# Patient Record
Sex: Female | Born: 1980
Health system: Southern US, Community
[De-identification: ages and names within clinical notes are randomized; demographics above are authoritative.]

---

## 2003-02-13 ENCOUNTER — Emergency Department (HOSPITAL_COMMUNITY): Admission: EM | Admit: 2003-02-13 | Discharge: 2003-02-13 | Payer: Self-pay | Admitting: Unknown Physician Specialty

## 2003-02-14 ENCOUNTER — Encounter: Payer: Self-pay | Admitting: Emergency Medicine

## 2003-02-14 ENCOUNTER — Ambulatory Visit (HOSPITAL_COMMUNITY): Admission: RE | Admit: 2003-02-14 | Discharge: 2003-02-14 | Payer: Self-pay | Admitting: Emergency Medicine

## 2003-03-03 ENCOUNTER — Other Ambulatory Visit: Admission: RE | Admit: 2003-03-03 | Discharge: 2003-03-03 | Payer: Self-pay | Admitting: Obstetrics and Gynecology

## 2003-03-03 ENCOUNTER — Other Ambulatory Visit: Admission: RE | Admit: 2003-03-03 | Discharge: 2003-03-03 | Payer: Self-pay | Admitting: Internal Medicine

## 2004-05-01 ENCOUNTER — Emergency Department (HOSPITAL_COMMUNITY): Admission: EM | Admit: 2004-05-01 | Discharge: 2004-05-01 | Payer: Self-pay | Admitting: Family Medicine

## 2004-05-02 ENCOUNTER — Emergency Department (HOSPITAL_COMMUNITY): Admission: EM | Admit: 2004-05-02 | Discharge: 2004-05-02 | Payer: Self-pay | Admitting: Family Medicine

## 2004-05-03 ENCOUNTER — Ambulatory Visit (HOSPITAL_COMMUNITY): Admission: RE | Admit: 2004-05-03 | Discharge: 2004-05-03 | Payer: Self-pay | Admitting: Family Medicine

## 2004-07-09 ENCOUNTER — Other Ambulatory Visit: Admission: RE | Admit: 2004-07-09 | Discharge: 2004-07-09 | Payer: Self-pay | Admitting: Family Medicine

## 2004-08-16 ENCOUNTER — Emergency Department (HOSPITAL_COMMUNITY): Admission: EM | Admit: 2004-08-16 | Discharge: 2004-08-16 | Payer: Self-pay | Admitting: Emergency Medicine

## 2005-08-24 ENCOUNTER — Emergency Department (HOSPITAL_COMMUNITY): Admission: EM | Admit: 2005-08-24 | Discharge: 2005-08-24 | Payer: Self-pay | Admitting: Emergency Medicine

## 2005-09-15 ENCOUNTER — Encounter: Admission: RE | Admit: 2005-09-15 | Discharge: 2005-09-15 | Payer: Self-pay | Admitting: Neurology

## 2006-01-11 ENCOUNTER — Other Ambulatory Visit: Admission: RE | Admit: 2006-01-11 | Discharge: 2006-01-11 | Payer: Self-pay | Admitting: Family Medicine

## 2007-01-12 ENCOUNTER — Other Ambulatory Visit: Admission: RE | Admit: 2007-01-12 | Discharge: 2007-01-12 | Payer: Self-pay | Admitting: Family Medicine

## 2007-06-28 ENCOUNTER — Encounter: Admission: RE | Admit: 2007-06-28 | Discharge: 2007-06-28 | Payer: Self-pay | Admitting: Family Medicine

## 2008-01-15 ENCOUNTER — Other Ambulatory Visit: Admission: RE | Admit: 2008-01-15 | Discharge: 2008-01-15 | Payer: Self-pay | Admitting: Family Medicine

## 2009-03-11 ENCOUNTER — Other Ambulatory Visit: Admission: RE | Admit: 2009-03-11 | Discharge: 2009-03-11 | Payer: Self-pay | Admitting: Family Medicine

## 2010-01-02 ENCOUNTER — Emergency Department (HOSPITAL_COMMUNITY): Admission: EM | Admit: 2010-01-02 | Discharge: 2010-01-02 | Payer: Self-pay | Admitting: Emergency Medicine

## 2010-03-12 ENCOUNTER — Other Ambulatory Visit: Admission: RE | Admit: 2010-03-12 | Discharge: 2010-03-12 | Payer: Self-pay | Admitting: Family Medicine

## 2010-03-19 ENCOUNTER — Encounter: Admission: RE | Admit: 2010-03-19 | Discharge: 2010-03-19 | Payer: Self-pay | Admitting: Family Medicine

## 2010-03-30 ENCOUNTER — Other Ambulatory Visit: Admission: RE | Admit: 2010-03-30 | Discharge: 2010-03-30 | Payer: Self-pay | Admitting: Interventional Radiology

## 2010-03-30 ENCOUNTER — Encounter: Admission: RE | Admit: 2010-03-30 | Discharge: 2010-03-30 | Payer: Self-pay | Admitting: Family Medicine

## 2010-06-19 ENCOUNTER — Emergency Department (HOSPITAL_COMMUNITY): Admission: EM | Admit: 2010-06-19 | Discharge: 2010-06-19 | Payer: Self-pay | Admitting: Emergency Medicine

## 2010-12-05 ENCOUNTER — Encounter: Payer: Self-pay | Admitting: Family Medicine

## 2011-01-28 LAB — POCT URINALYSIS DIPSTICK
Bilirubin Urine: NEGATIVE
Glucose, UA: NEGATIVE mg/dL
Hgb urine dipstick: NEGATIVE
Ketones, ur: NEGATIVE mg/dL
Nitrite: NEGATIVE
Protein, ur: NEGATIVE mg/dL
Specific Gravity, Urine: 1.015 (ref 1.005–1.030)
Urobilinogen, UA: 0.2 mg/dL (ref 0.0–1.0)
pH: 8.5 — ABNORMAL HIGH (ref 5.0–8.0)

## 2011-01-28 LAB — POCT PREGNANCY, URINE: Preg Test, Ur: NEGATIVE

## 2011-03-15 ENCOUNTER — Other Ambulatory Visit (HOSPITAL_COMMUNITY)
Admission: RE | Admit: 2011-03-15 | Discharge: 2011-03-15 | Disposition: A | Discharge status: Home / Self Care | Payer: BC Managed Care – PPO | Source: Ambulatory Visit | Attending: Family Medicine | Admitting: Family Medicine

## 2011-03-15 ENCOUNTER — Other Ambulatory Visit: Payer: Self-pay | Admitting: Family Medicine

## 2011-03-15 DIAGNOSIS — Z01419 Encounter for gynecological examination (general) (routine) without abnormal findings: Secondary | ICD-10-CM | POA: Insufficient documentation

## 2011-04-15 ENCOUNTER — Other Ambulatory Visit: Payer: Self-pay | Admitting: Family Medicine

## 2011-04-15 DIAGNOSIS — R102 Pelvic and perineal pain: Secondary | ICD-10-CM

## 2011-04-18 ENCOUNTER — Ambulatory Visit
Admission: RE | Admit: 2011-04-18 | Discharge: 2011-04-18 | Disposition: A | Discharge status: Home / Self Care | Payer: BC Managed Care – PPO | Source: Ambulatory Visit | Attending: Family Medicine | Admitting: Family Medicine

## 2011-04-18 DIAGNOSIS — R102 Pelvic and perineal pain: Secondary | ICD-10-CM

## 2011-05-19 ENCOUNTER — Other Ambulatory Visit: Payer: Self-pay | Admitting: Family Medicine

## 2011-05-19 DIAGNOSIS — N83202 Unspecified ovarian cyst, left side: Secondary | ICD-10-CM

## 2011-06-15 ENCOUNTER — Other Ambulatory Visit: Payer: BC Managed Care – PPO

## 2011-06-22 ENCOUNTER — Ambulatory Visit
Admission: RE | Admit: 2011-06-22 | Discharge: 2011-06-22 | Disposition: A | Discharge status: Home / Self Care | Payer: BC Managed Care – PPO | Source: Ambulatory Visit | Attending: Family Medicine | Admitting: Family Medicine

## 2011-06-22 DIAGNOSIS — N83202 Unspecified ovarian cyst, left side: Secondary | ICD-10-CM

## 2011-11-28 ENCOUNTER — Other Ambulatory Visit: Payer: Self-pay | Admitting: Family Medicine

## 2011-11-28 DIAGNOSIS — E049 Nontoxic goiter, unspecified: Secondary | ICD-10-CM

## 2011-11-30 ENCOUNTER — Ambulatory Visit
Admission: RE | Admit: 2011-11-30 | Discharge: 2011-11-30 | Disposition: A | Discharge status: Home / Self Care | Payer: BC Managed Care – PPO | Source: Ambulatory Visit | Attending: Family Medicine | Admitting: Family Medicine

## 2011-11-30 DIAGNOSIS — E049 Nontoxic goiter, unspecified: Secondary | ICD-10-CM

## 2012-01-15 IMAGING — US US TRANSVAGINAL NON-OB
1 series · 13 of 25 positions shown · non-contrast
Comparison: 04/18/2011.

CLINICAL DATA: Left ovarian cystic lesion follow-up.  History of
pain with menstrual cycles.

TRANSVAGINAL ULTRASOUND OF PELVIS
TECHNIQUE: Transvaginal ultrasound examination of the pelvis was
performed including evaluation of the uterus, ovaries, adnexal
regions, and pelvic cul-de-sac.

[Series 1: us transvaginal non-ob · 0.10mm/px · 13 of 54 slices shown]
[im 1/54]
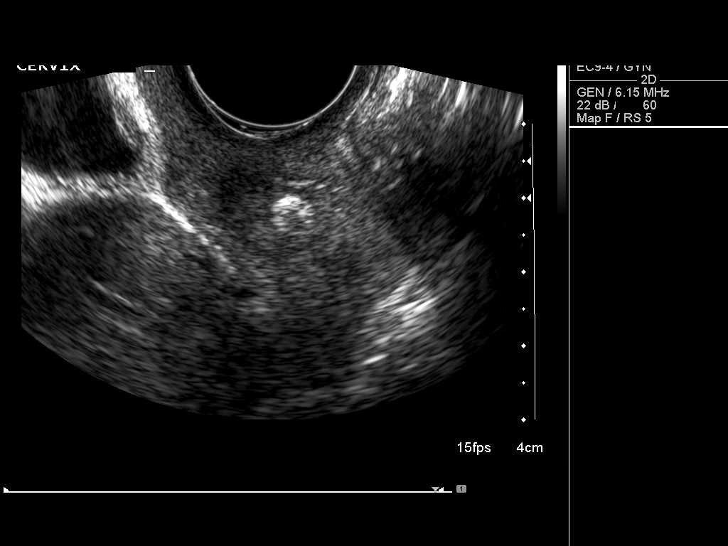
[im 5/54]
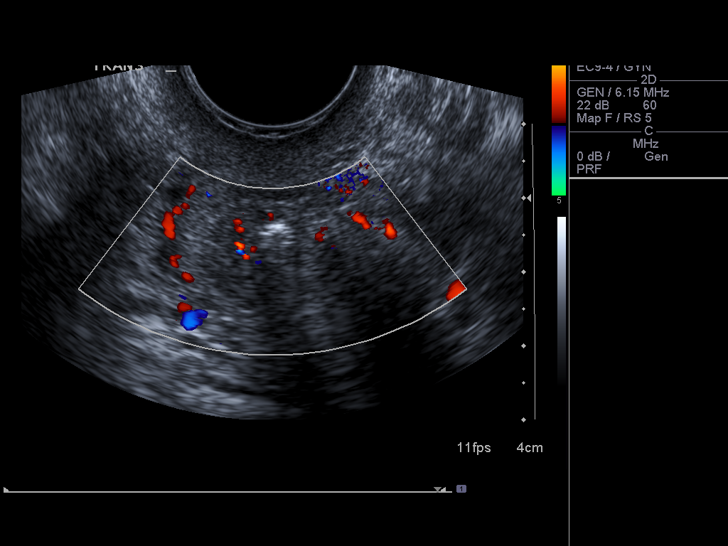
[im 9/54]
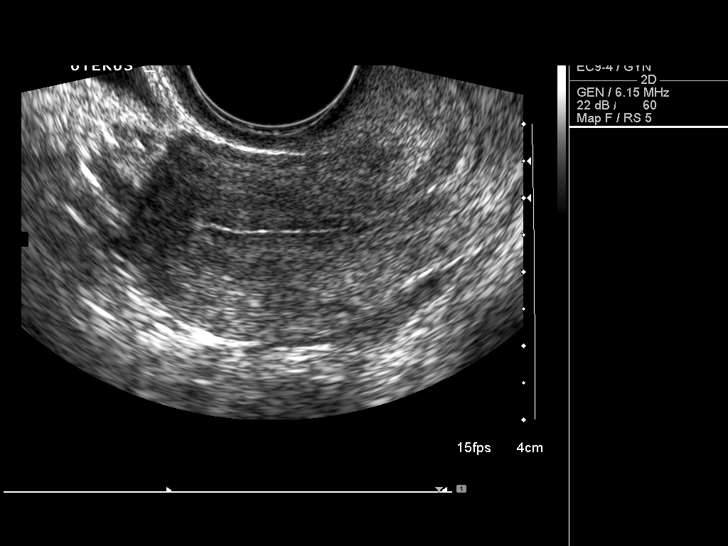
[im 14/54]
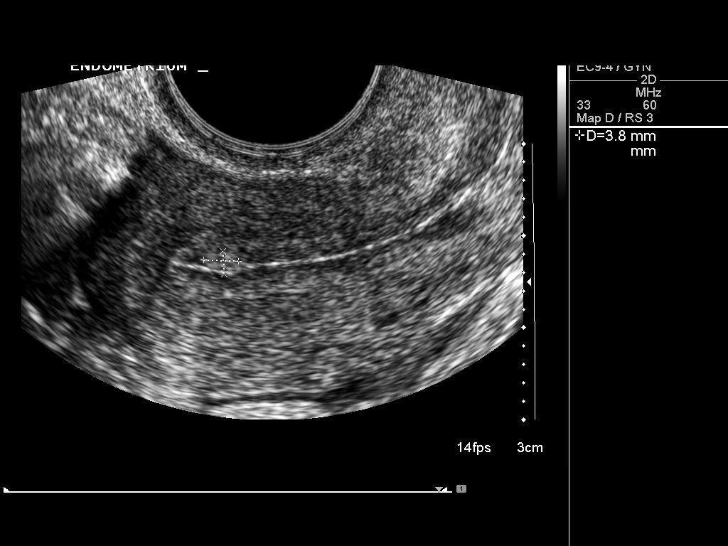
[im 18/54]
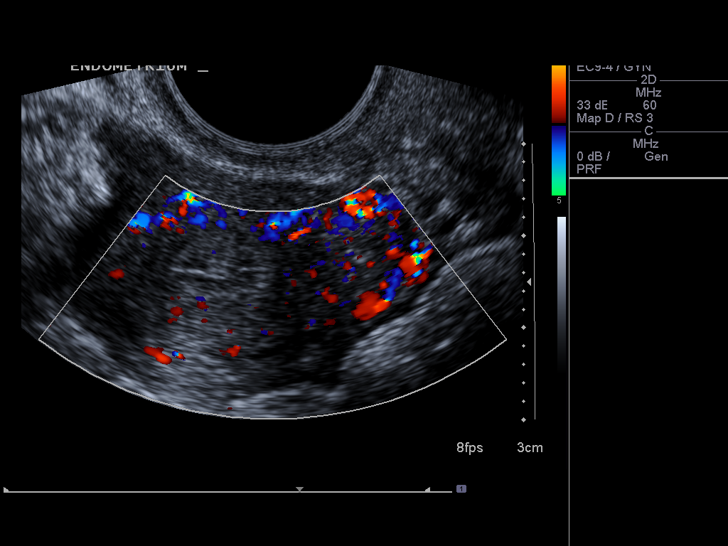
[im 23/54]
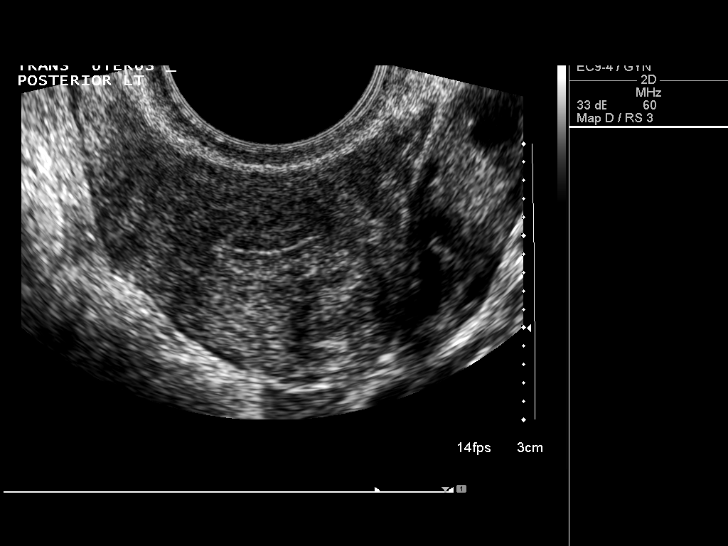
[im 27/54]
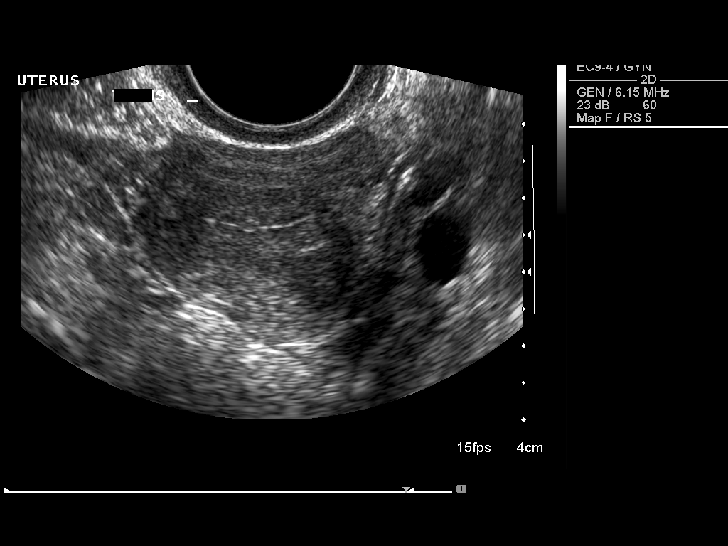
[im 31/54]
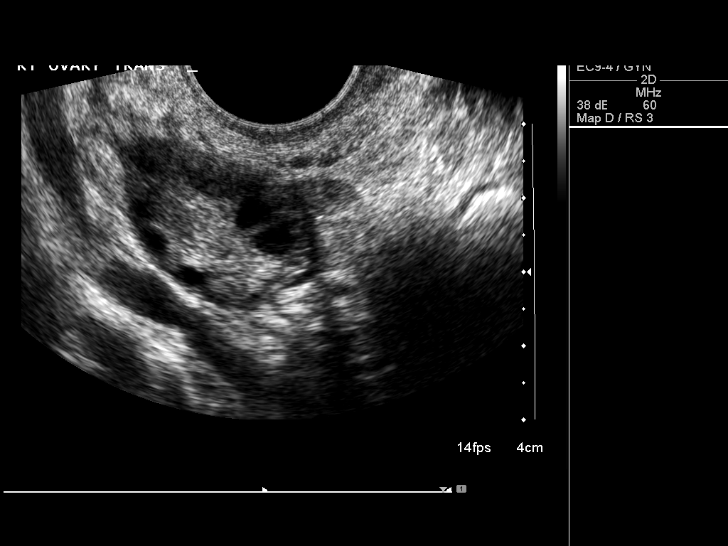
[im 36/54]
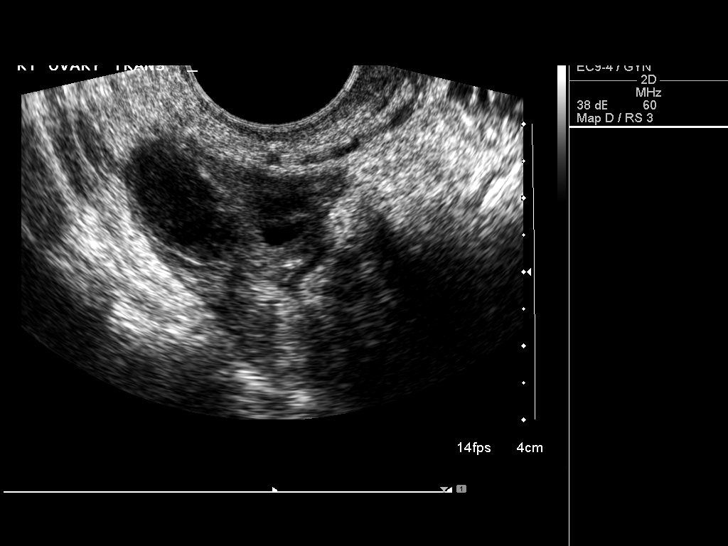
[im 40/54]
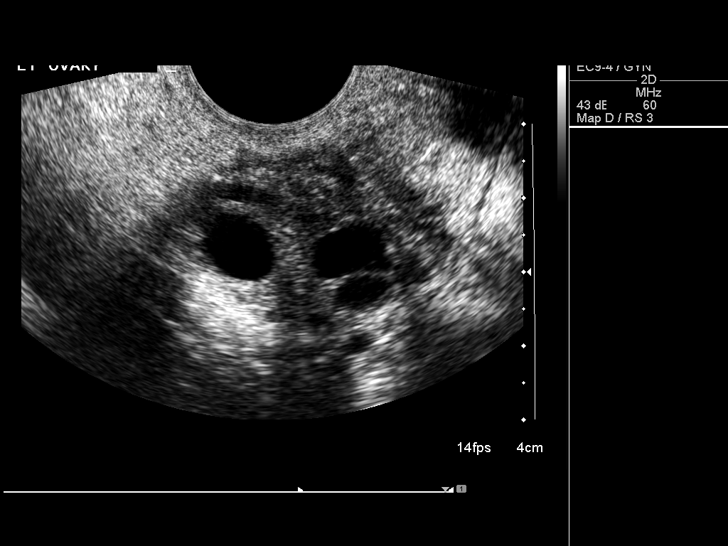
[im 45/54]
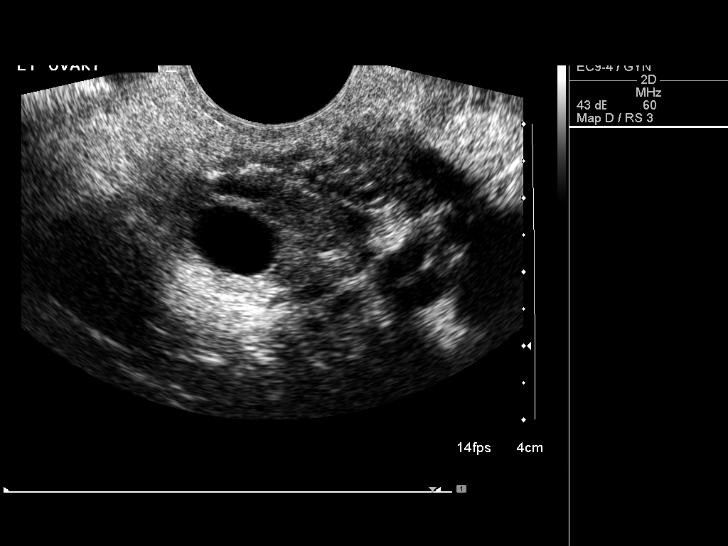
[im 49/54]
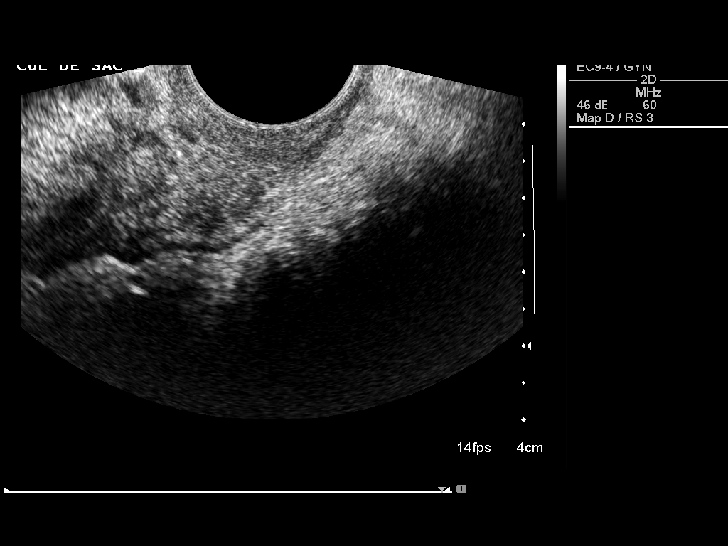
[im 54/54]
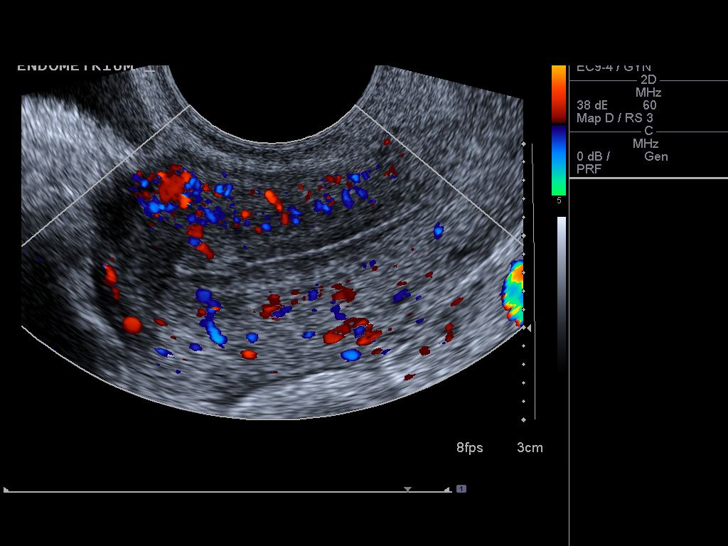

[13 of 25 positions shown; findings below may reference images not displayed]

FINDINGS: Uterus:  Uterus measures 6.7 x 3.0 x 3.1 cm.  There is a
hyperechoic area in the region of the cervix.  This area measures
6.5 x 5.1 x 4.5 cm.  There is some posterior acoustic shadowing
consistent with calcification.  There is an intramural uterine
leiomyoma measuring 1.3 x 1.3 x 1.2 cm.

Endometrium: Endometrial thickness is 7.5 mm.  No endometrial fluid
is seen.  There is a nodular area within the endometrium in the
fundal portion.  It is slightly hyperechoic.  It measures 3.8 x
x 2.4 mm and could reflect a polyp.

Right ovary: Right ovary measures 3.5 x 2.2 x 2.5 cm.  On the
previous study it measured 2.8 x 2.7 x 2.3 cm. There is a cystic
area with some internal echoes.  This area measured 1.9 x 1.2 x
cm.  There is no internal color Doppler flow.  This cystic area was
not seen on the previous study of 04/18/2011 and consistent with a
complicated cyst.  Small follicular cysts are also seen.

Left ovary: The left ovary measured 3.5 x 1.7 x 1.3 cm.  On the
previous study it measured 3.8 x 3.7 x 2.7 cm.  It is smaller on
the current study.  Small follicular cysts are now seen.  The
complex cystic area within the left ovary seen on the previous
study has resolved.

Other Findings:  No free fluid
IMPRESSION: There is a hyperechoic area in the region of the cervix.  This area
measures 6.5 x 5.1 x 4.5 cm.  There is some posterior acoustic
shadowing consistent with calcification.  There is an intramural
uterine leiomyoma measuring 1.3 x 1.3-1.2 cm.

 There is a nodular area within the endometrium in the fundal
portion.  It is slightly hyperechoic.  It measures 3.8 x 2.5 x
mm and could reflect a polyp. Hysterosonography may be considered
for additional evaluation.

Normal size of right ovary.There is a cystic area with some
internal echoes.  This area measured 1.9 x 1.2 x 1.4 cm.  There is
no internal color Doppler flow.  This cystic area was not seen on
the previous study of 04/18/2011 and consistent with a complicated
cyst.  Small follicular cysts are also seen.

Left ovary is smaller than on the previous study.  Small follicular
cysts are now seen.  The complex cystic area within the left ovary
seen on the previous study has resolved.

## 2013-04-10 ENCOUNTER — Ambulatory Visit (INDEPENDENT_AMBULATORY_CARE_PROVIDER_SITE_OTHER): Payer: BC Managed Care – PPO | Admitting: Physician Assistant

## 2013-04-10 VITALS — BP 146/86 | HR 69 | Temp 97.7°F | Resp 16 | Ht 67.0 in | Wt 159.6 lb

## 2013-04-10 DIAGNOSIS — M549 Dorsalgia, unspecified: Secondary | ICD-10-CM

## 2013-04-10 DIAGNOSIS — R300 Dysuria: Secondary | ICD-10-CM

## 2013-04-10 DIAGNOSIS — R309 Painful micturition, unspecified: Secondary | ICD-10-CM

## 2013-04-10 DIAGNOSIS — N390 Urinary tract infection, site not specified: Secondary | ICD-10-CM

## 2013-04-10 LAB — POCT URINALYSIS DIPSTICK
Bilirubin, UA: NEGATIVE
Glucose, UA: NEGATIVE
Ketones, UA: 15
Nitrite, UA: NEGATIVE
Protein, UA: >=300
Spec Grav, UA: 1.015
Urobilinogen, UA: 0.2
pH, UA: 6

## 2013-04-10 LAB — POCT UA - MICROSCOPIC ONLY
Casts, Ur, LPF, POC: NEGATIVE
Crystals, Ur, HPF, POC: NEGATIVE
Yeast, UA: NEGATIVE

## 2013-04-10 MED ORDER — PHENAZOPYRIDINE HCL 200 MG PO TABS: 200.0000 mg | ORAL_TABLET | Freq: Three times a day (TID) | ORAL | Status: DC | PRN

## 2013-04-10 MED ORDER — NITROFURANTOIN MONOHYD MACRO 100 MG PO CAPS: 100.0000 mg | ORAL_CAPSULE | Freq: Two times a day (BID) | ORAL | Status: DC

## 2013-04-10 NOTE — Patient Instructions (Addendum)
Begin taking the antibiotic as directed.  Be sure to finish the full course.  Take pyridium if needed for symptoms.  Plenty of fluids (water is best!)  Please let me know if symptoms are worsening or not improving.  I will let you know when the culture data is back and if we need to make any changes based on that.   Urinary Tract Infection Urinary tract infections (UTIs) can develop anywhere along your urinary tract. Your urinary tract is your body's drainage system for removing wastes and extra water. Your urinary tract includes two kidneys, two ureters, a bladder, and a urethra. Your kidneys are a pair of bean-shaped organs. Each kidney is about the size of your fist. They are located below your ribs, one on each side of your spine. CAUSES Infections are caused by microbes, which are microscopic organisms, including fungi, viruses, and bacteria. These organisms are so small that they can only be seen through a microscope. Bacteria are the microbes that most commonly cause UTIs. SYMPTOMS  Symptoms of UTIs may vary by age and gender of the patient and by the location of the infection. Symptoms in young women typically include a frequent and intense urge to urinate and a painful, burning feeling in the bladder or urethra during urination. Older women and men are more likely to be tired, shaky, and weak and have muscle aches and abdominal pain. A fever may mean the infection is in your kidneys. Other symptoms of a kidney infection include pain in your back or sides below the ribs, nausea, and vomiting. DIAGNOSIS To diagnose a UTI, your caregiver will ask you about your symptoms. Your caregiver also will ask to provide a urine sample. The urine sample will be tested for bacteria and white blood cells. White blood cells are made by your body to help fight infection. TREATMENT  Typically, UTIs can be treated with medication. Because most UTIs are caused by a bacterial infection, they usually can be treated  with the use of antibiotics. The choice of antibiotic and length of treatment depend on your symptoms and the type of bacteria causing your infection. HOME CARE INSTRUCTIONS  If you were prescribed antibiotics, take them exactly as your caregiver instructs you. Finish the medication even if you feel better after you have only taken some of the medication.  Drink enough water and fluids to keep your urine clear or pale yellow.  Avoid caffeine, tea, and carbonated beverages. They tend to irritate your bladder.  Empty your bladder often. Avoid holding urine for long periods of time.  Empty your bladder before and after sexual intercourse.  After a bowel movement, women should cleanse from front to back. Use each tissue only once. SEEK MEDICAL CARE IF:   You have back pain.  You develop a fever.  Your symptoms do not begin to resolve within 3 days. SEEK IMMEDIATE MEDICAL CARE IF:   You have severe back pain or lower abdominal pain.  You develop chills.  You have nausea or vomiting.  You have continued burning or discomfort with urination. MAKE SURE YOU:   Understand these instructions.  Will watch your condition.  Will get help right away if you are not doing well or get worse. Document Released: 08/10/2005 Document Revised: 05/01/2012 Document Reviewed: 12/09/2011 [facility] Patient Information 2014 [place], [place].

## 2013-04-10 NOTE — Progress Notes (Signed)
Subjective:    Patient ID: Miranda Rivera, female    DOB: 03-21-1981, 32 y.o.   MRN: [id number]  HPI   Miranda Rivera is a very pleasant 32 yr old female here with concern for illness.  Has noted a couple days of abnormal urine odor.  Today began experiencing fullness, pain in lower abdomen with urination.  Also with frequency.  This afternoon she noted frank hematuria which prompted her to come in.  Has also been having some right sided back pain today.  No NV, FC.  She has never had a UTI before.  Denies abnormal vaginal discharge.  Has had a yeast infection in this past, but this feels different.  LMP 04/02/13.  Sexually active with her husband only, no concern for STIs.     Review of Systems  Constitutional: Negative for fever and chills.  HENT: Negative.   Respiratory: Negative.   Cardiovascular: Negative.   Gastrointestinal: Negative.   Genitourinary: Positive for dysuria, frequency and hematuria. Negative for vaginal discharge and pelvic pain.  Musculoskeletal: Negative.   Skin: Negative.   Neurological: Negative.        Objective:   Physical Exam  Vitals reviewed. Constitutional: She is oriented to person, place, and time. She appears well-developed and well-nourished. No distress.  HENT:  Head: Normocephalic and atraumatic.  Eyes: Conjunctivae are normal. No scleral icterus.  Cardiovascular: Normal rate, regular rhythm and normal heart sounds.   Pulmonary/Chest: Effort normal and breath sounds normal. She has no wheezes. She has no rales.  Abdominal: Soft. Bowel sounds are normal. There is tenderness in the suprapubic area. There is no rigidity, no rebound, no guarding and no CVA tenderness.  Neurological: She is alert and oriented to person, place, and time.  Skin: Skin is warm and dry.  Psychiatric: She has a normal mood and affect. Her behavior is normal.     Results for orders placed in visit on 04/10/13  POCT URINALYSIS DIPSTICK      Result Value Range   Color, UA  bloody     Clarity, UA cloudy     Glucose, UA neg     Bilirubin, UA neg     Ketones, UA 15     Spec Grav, UA 1.015     Blood, UA large     pH, UA 6.0     Protein, UA >=300     Urobilinogen, UA 0.2     Nitrite, UA neg     Leukocytes, UA moderate (2+)    POCT UA - MICROSCOPIC ONLY      Result Value Range   WBC, Ur, HPF, POC 5-10     RBC, urine, microscopic tntc     Bacteria, U Microscopic trace     Mucus, UA small     Epithelial cells, urine per micros 0-1     Crystals, Ur, HPF, POC neg     Casts, Ur, LPF, POC neg     Yeast, UA neg          Assessment & Plan:  UTI (urinary tract infection) - Plan: Urine culture, nitrofurantoin, macrocrystal-monohydrate, (MACROBID) 100 MG capsule, phenazopyridine (PYRIDIUM) 200 MG tablet  Painful urination - Plan: POCT urinalysis dipstick, POCT UA - Microscopic Only, phenazopyridine (PYRIDIUM) 200 MG tablet  Back pain - Plan: POCT urinalysis dipstick, POCT UA - Microscopic Only   Miranda Rivera is a very pleasant 31 yr old female with UTI.  UA reveals large blood and moderate leuks.  Urine cx sent.  Pt  with subjective right flank pain but no CVA tenderness, nausea, vomiting, or fever; therefore I am less concerned about pyelo.  Will start Macrobid x 5 days.  Will adjust based on culture data if needed.  Pyridium q8h prn symptoms.  Push fluids.  If worsening or not improving, pt to RTC for further evaluation.

## 2013-04-13 LAB — URINE CULTURE: Colony Count: >=100000

## 2013-11-01 ENCOUNTER — Other Ambulatory Visit: Payer: Self-pay | Admitting: Family Medicine

## 2013-11-01 ENCOUNTER — Other Ambulatory Visit (HOSPITAL_COMMUNITY)
Admission: RE | Admit: 2013-11-01 | Discharge: 2013-11-01 | Disposition: A | Discharge status: Home / Self Care | Payer: BC Managed Care – PPO | Source: Ambulatory Visit | Attending: Family Medicine | Admitting: Family Medicine

## 2013-11-01 DIAGNOSIS — Z01419 Encounter for gynecological examination (general) (routine) without abnormal findings: Secondary | ICD-10-CM | POA: Insufficient documentation

## 2013-11-01 DIAGNOSIS — Z1151 Encounter for screening for human papillomavirus (HPV): Secondary | ICD-10-CM | POA: Insufficient documentation

## 2014-11-03 ENCOUNTER — Other Ambulatory Visit: Payer: Self-pay | Admitting: Obstetrics and Gynecology

## 2014-11-05 LAB — CYTOLOGY - PAP

## 2015-02-22 ENCOUNTER — Ambulatory Visit (INDEPENDENT_AMBULATORY_CARE_PROVIDER_SITE_OTHER): Payer: Self-pay | Admitting: Family Medicine

## 2015-02-22 VITALS — BP 132/80 | HR 60 | Temp 98.0°F | Resp 17 | Ht 68.5 in | Wt 154.0 lb

## 2015-02-22 DIAGNOSIS — R05 Cough: Secondary | ICD-10-CM

## 2015-02-22 DIAGNOSIS — R059 Cough, unspecified: Secondary | ICD-10-CM

## 2015-02-22 DIAGNOSIS — J209 Acute bronchitis, unspecified: Secondary | ICD-10-CM

## 2015-02-22 MED ORDER — PREDNISONE 20 MG PO TABS: 40.0000 mg | ORAL_TABLET | Freq: Every day | ORAL | Status: DC

## 2015-02-22 MED ORDER — HYDROCODONE-HOMATROPINE 5-1.5 MG/5ML PO SYRP: 5.0000 mL | ORAL_SOLUTION | Freq: Three times a day (TID) | ORAL | Status: DC | PRN

## 2015-02-22 MED ORDER — AZITHROMYCIN 250 MG PO TABS: ORAL_TABLET | ORAL | Status: DC

## 2015-02-22 NOTE — Patient Instructions (Signed)

## 2015-02-22 NOTE — Progress Notes (Addendum)
° °  Subjective:  This chart was scribed for Elvina SidleKurt Lauenstein, MD, by Elon SpannerGarrett Cook, ED Scribe. This patient was seen in room Rm 5 and the patient's care was started at 10:05 AM.   Patient ID: Miranda Rivera, female    DOB: [birth year], 34 y.o.   MRN: [id number] Chief Complaint  Patient presents with   Cough   Laryngitis   URI   Abdominal Pain    HPI HPI Comments: Miranda LevelKhadijah Rivera is a 34 y.o. female who presents to the Emergency Department complaining of a cough, intermittently productive of green sputum, present only at night and preventing her from sleeping onset 1 week ago.  She also notes a current voice hoarse and abdominal pain secondary to cough.  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  She has a history of allergies for which she takes medication.  Patient denies a history of asthma.  Patient denies fever.     Review of Systems     Objective:   Physical Exam  Constitutional: She is oriented to person, place, and time. She appears well-developed and well-nourished. No distress.  HENT:  Head: Normocephalic and atraumatic.  Eyes: Conjunctivae and EOM are normal.  Neck: Normal range of motion. Neck supple. No tracheal deviation present. No thyromegaly present.  Cardiovascular: Normal rate, regular rhythm and normal heart sounds.   Pulmonary/Chest: Effort normal. No respiratory distress. She has wheezes. She has no rales. She exhibits no tenderness.  Musculoskeletal: Normal range of motion.  Neurological: She is alert and oriented to person, place, and time.  Skin: Skin is warm and dry.  Psychiatric: She has a normal mood and affect. Her behavior is normal.  Nursing note and vitals reviewed.      Assessment & Plan:  10:13 AM Will prescribe antibiotics, cough medication, and oral steroids.  Patient acknowledges and agrees with plan.   This chart was scribed in my presence and reviewed by me personally.    ICD-9-CM ICD-10-CM   1. Cough 786.2 R05 azithromycin  (ZITHROMAX Z-PAK) 250 MG tablet     predniSONE (DELTASONE) 20 MG tablet     HYDROcodone-homatropine (HYCODAN) 5-1.5 MG/5ML syrup  2. Acute bronchitis, unspecified organism 466.0 J20.9 azithromycin (ZITHROMAX Z-PAK) 250 MG tablet     predniSONE (DELTASONE) 20 MG tablet     Signed, Elvina SidleKurt Lauenstein, MD

## 2015-05-05 ENCOUNTER — Other Ambulatory Visit: Payer: Self-pay | Admitting: Family Medicine

## 2015-05-05 DIAGNOSIS — E041 Nontoxic single thyroid nodule: Secondary | ICD-10-CM

## 2015-05-05 DIAGNOSIS — E049 Nontoxic goiter, unspecified: Secondary | ICD-10-CM

## 2015-05-07 ENCOUNTER — Ambulatory Visit
Admission: RE | Admit: 2015-05-07 | Discharge: 2015-05-07 | Disposition: A | Discharge status: Home / Self Care | Payer: BLUE CROSS/BLUE SHIELD | Source: Ambulatory Visit | Attending: Family Medicine | Admitting: Family Medicine

## 2015-05-07 DIAGNOSIS — E049 Nontoxic goiter, unspecified: Secondary | ICD-10-CM

## 2015-05-07 DIAGNOSIS — E041 Nontoxic single thyroid nodule: Secondary | ICD-10-CM

## 2015-11-30 IMAGING — US US SOFT TISSUE HEAD/NECK
1 series · 13 of 25 positions shown · non-contrast
Comparison: 11/30/2011;

CLINICAL DATA: Left-sided thyroid nodule follow-up. History of
prior left-sided thyroid nodule biopsy (1022).

EXAM:
THYROID ULTRASOUND
TECHNIQUE: Ultrasound examination of the thyroid gland and adjacent soft
tissues was performed.

[Series 1: us soft tissue head/neck · 0.08mm/px · 13 of 40 slices shown]
[im 1/40]
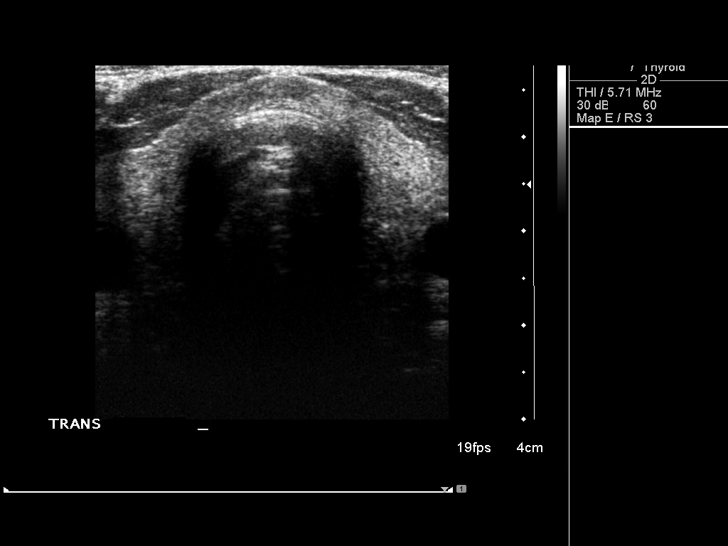
[im 4/40]
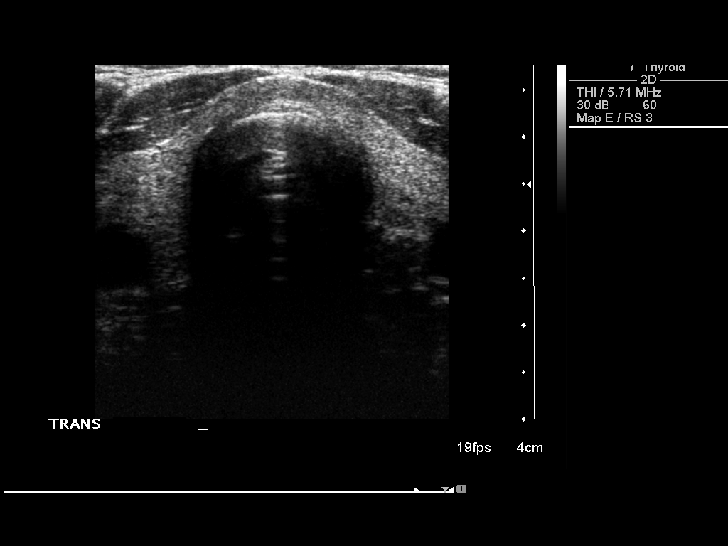
[im 7/40]
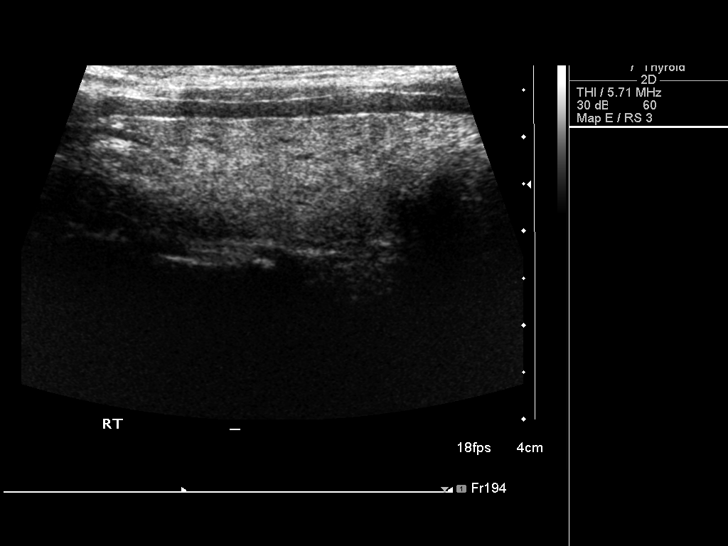
[im 10/40]
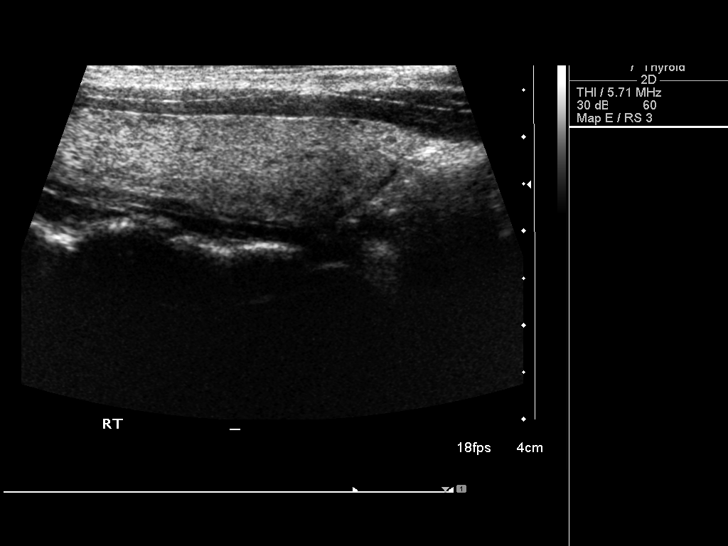
[im 14/40]
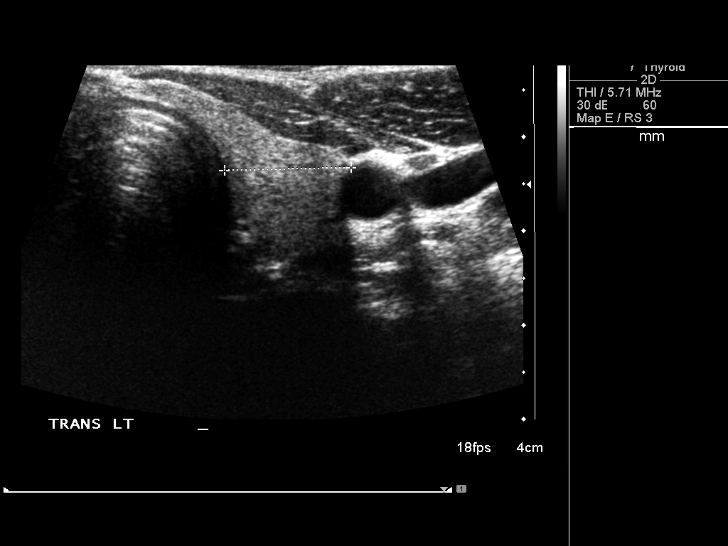
[im 17/40]
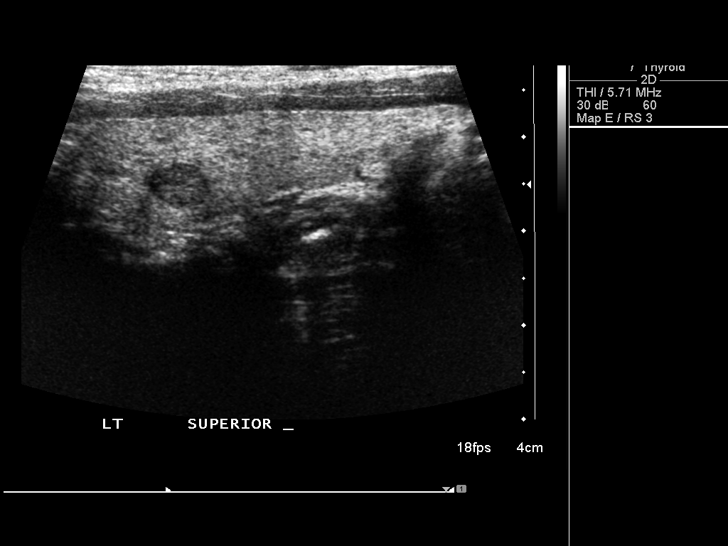
[im 20/40]
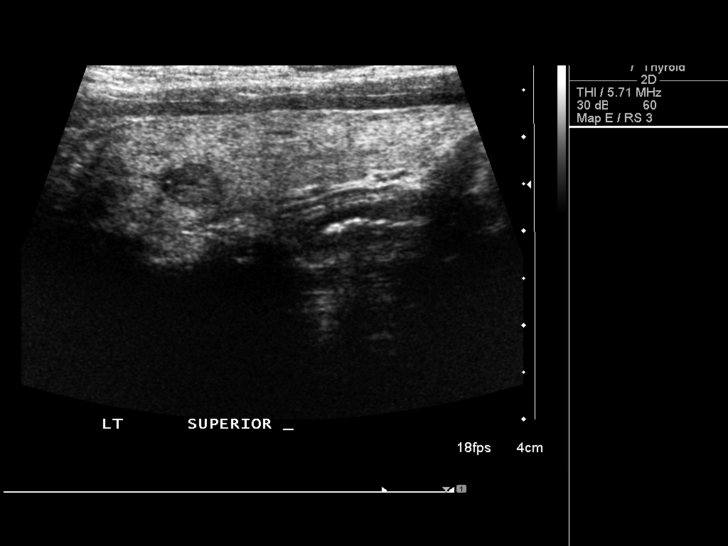
[im 23/40]
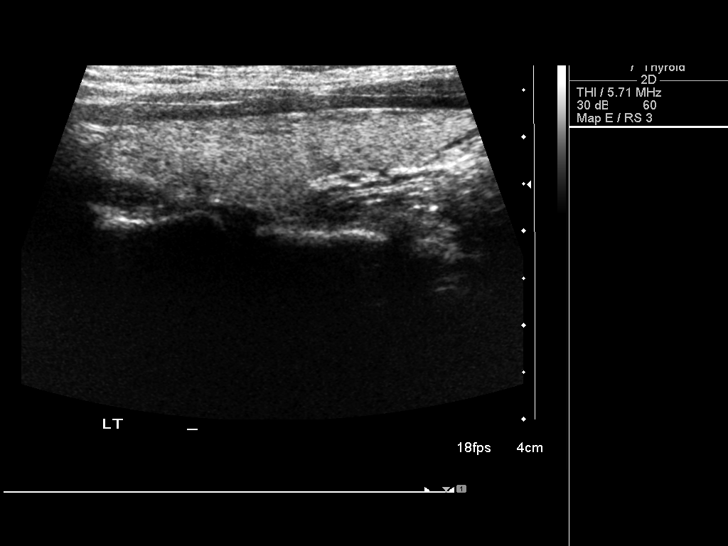
[im 27/40]
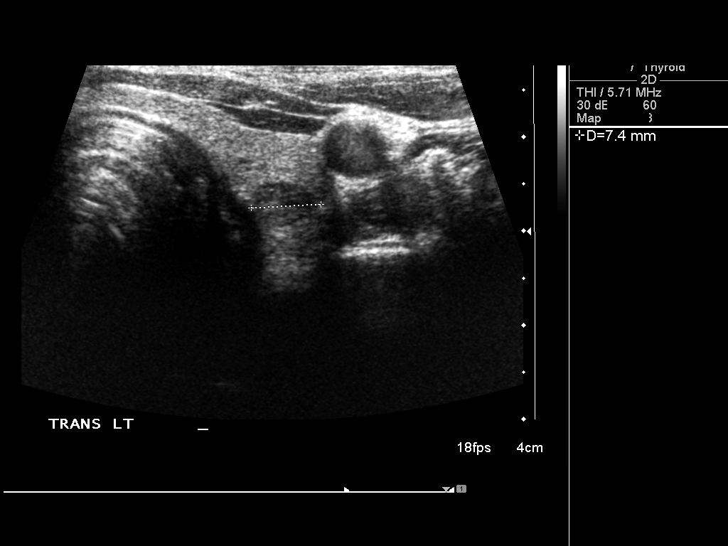
[im 30/40]
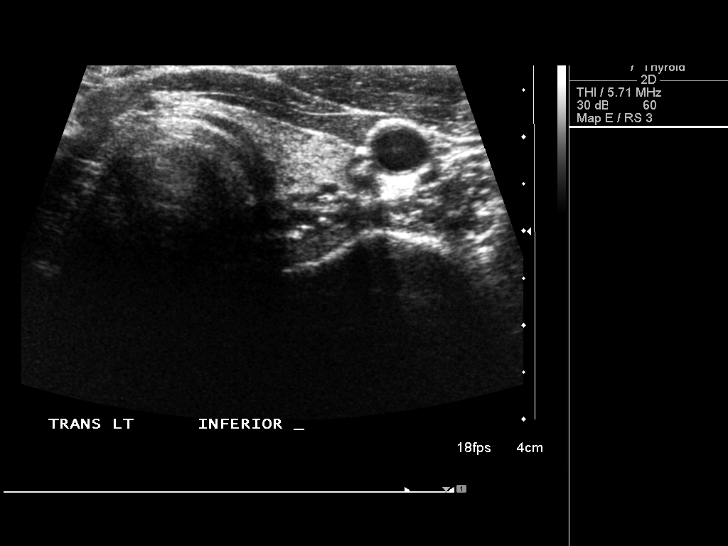
[im 33/40]
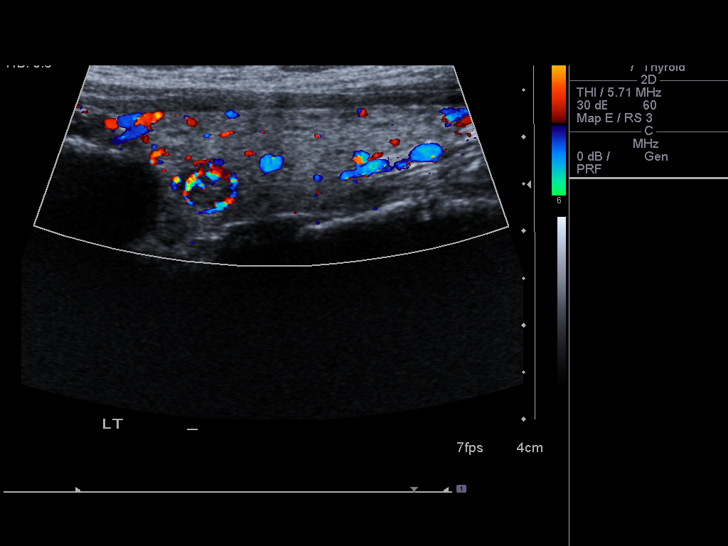
[im 36/40]
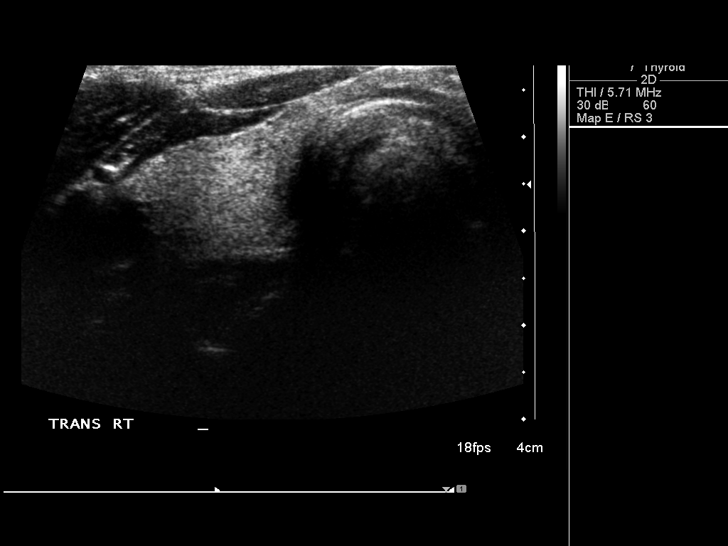
[im 40/40]
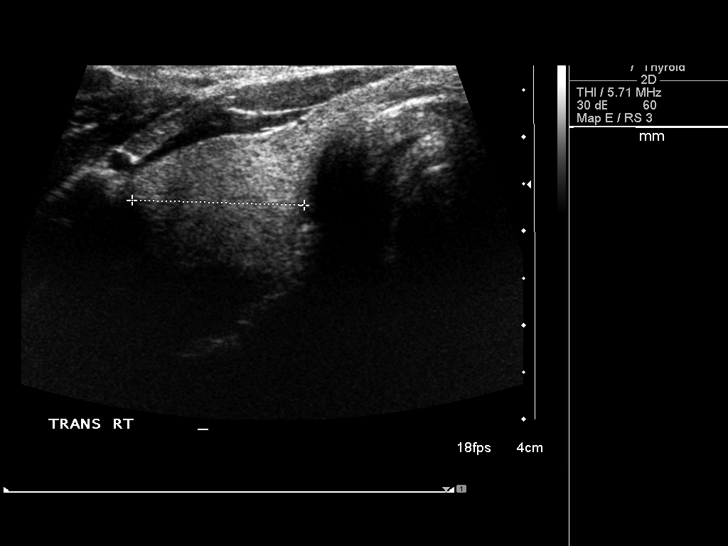

[13 of 25 positions shown; findings below may reference images not displayed]

04/08/2010; left-sided thyroid nodule
ultrasound-guided fine-needle aspiration - 03/30/2010
FINDINGS: No new or enlarging thyroid nodules.

Right thyroid lobe

Measurements: Normal in size measuring 4.7 x 1.4 x 1.8 cm.

There is mild diffuse heterogeneity of the right lobe of the thyroid
without discrete nodule or mass.

Left thyroid lobe

Measurements: Normal in size measuring 4.3 x 1.1 x 1.3 cm.

Left, superior - 0.8 x 0.6 x 0.7 cm - mixed echogenic, solid -
grossly unchanged, 0.6 x 0.5 x 0.5 cm when compared to the
11/30/2011 examination. This nodule was previously biopsied.

Isthmus

Thickness: Normal in size measures 0.4 cm in diameter.

No discrete nodule or mass is identified within the thyroid isthmus.

Lymphadenopathy

None visualized.
IMPRESSION: Grossly unchanged punctate (approximately 0.8 cm) nodule within the
superior aspect of the left lobe of the thyroid. No new or enlarging
thyroid nodules.

## 2019-11-04 DIAGNOSIS — H40033 Anatomical narrow angle, bilateral: Secondary | ICD-10-CM | POA: Diagnosis not present

## 2019-11-04 DIAGNOSIS — H04123 Dry eye syndrome of bilateral lacrimal glands: Secondary | ICD-10-CM | POA: Diagnosis not present

## 2019-12-09 DIAGNOSIS — Z6828 Body mass index (BMI) 28.0-28.9, adult: Secondary | ICD-10-CM | POA: Diagnosis not present

## 2019-12-09 DIAGNOSIS — Z01419 Encounter for gynecological examination (general) (routine) without abnormal findings: Secondary | ICD-10-CM | POA: Diagnosis not present

## 2019-12-09 DIAGNOSIS — Z1231 Encounter for screening mammogram for malignant neoplasm of breast: Secondary | ICD-10-CM | POA: Diagnosis not present

## 2019-12-11 DIAGNOSIS — Z0000 Encounter for general adult medical examination without abnormal findings: Secondary | ICD-10-CM | POA: Diagnosis not present

## 2019-12-11 DIAGNOSIS — K219 Gastro-esophageal reflux disease without esophagitis: Secondary | ICD-10-CM | POA: Diagnosis not present

## 2019-12-11 DIAGNOSIS — E049 Nontoxic goiter, unspecified: Secondary | ICD-10-CM | POA: Diagnosis not present

## 2019-12-11 DIAGNOSIS — Z13220 Encounter for screening for lipoid disorders: Secondary | ICD-10-CM | POA: Diagnosis not present

## 2019-12-12 DIAGNOSIS — E049 Nontoxic goiter, unspecified: Secondary | ICD-10-CM | POA: Diagnosis not present

## 2019-12-12 DIAGNOSIS — Z13220 Encounter for screening for lipoid disorders: Secondary | ICD-10-CM | POA: Diagnosis not present

## 2020-03-28 DIAGNOSIS — Z03818 Encounter for observation for suspected exposure to other biological agents ruled out: Secondary | ICD-10-CM | POA: Diagnosis not present

## 2020-03-28 DIAGNOSIS — Z20828 Contact with and (suspected) exposure to other viral communicable diseases: Secondary | ICD-10-CM | POA: Diagnosis not present

## 2020-04-30 DIAGNOSIS — D259 Leiomyoma of uterus, unspecified: Secondary | ICD-10-CM | POA: Diagnosis not present

## 2020-04-30 DIAGNOSIS — N939 Abnormal uterine and vaginal bleeding, unspecified: Secondary | ICD-10-CM | POA: Diagnosis not present

## 2020-08-14 DIAGNOSIS — Z20828 Contact with and (suspected) exposure to other viral communicable diseases: Secondary | ICD-10-CM | POA: Diagnosis not present

## 2020-08-14 DIAGNOSIS — J029 Acute pharyngitis, unspecified: Secondary | ICD-10-CM | POA: Diagnosis not present

## 2020-08-14 DIAGNOSIS — R519 Headache, unspecified: Secondary | ICD-10-CM | POA: Diagnosis not present

## 2021-03-02 DIAGNOSIS — Z01419 Encounter for gynecological examination (general) (routine) without abnormal findings: Secondary | ICD-10-CM | POA: Diagnosis not present

## 2021-03-02 DIAGNOSIS — Z6826 Body mass index (BMI) 26.0-26.9, adult: Secondary | ICD-10-CM | POA: Diagnosis not present

## 2021-03-02 DIAGNOSIS — Z1231 Encounter for screening mammogram for malignant neoplasm of breast: Secondary | ICD-10-CM | POA: Diagnosis not present

## 2021-03-03 DIAGNOSIS — Z0000 Encounter for general adult medical examination without abnormal findings: Secondary | ICD-10-CM | POA: Diagnosis not present

## 2021-03-03 DIAGNOSIS — E049 Nontoxic goiter, unspecified: Secondary | ICD-10-CM | POA: Diagnosis not present

## 2021-03-03 DIAGNOSIS — Z13220 Encounter for screening for lipoid disorders: Secondary | ICD-10-CM | POA: Diagnosis not present

## 2021-03-03 DIAGNOSIS — K219 Gastro-esophageal reflux disease without esophagitis: Secondary | ICD-10-CM | POA: Diagnosis not present

## 2022-03-03 DIAGNOSIS — Z124 Encounter for screening for malignant neoplasm of cervix: Secondary | ICD-10-CM | POA: Diagnosis not present

## 2022-03-03 DIAGNOSIS — Z1151 Encounter for screening for human papillomavirus (HPV): Secondary | ICD-10-CM | POA: Diagnosis not present

## 2022-03-03 DIAGNOSIS — Z6828 Body mass index (BMI) 28.0-28.9, adult: Secondary | ICD-10-CM | POA: Diagnosis not present

## 2022-03-03 DIAGNOSIS — Z01419 Encounter for gynecological examination (general) (routine) without abnormal findings: Secondary | ICD-10-CM | POA: Diagnosis not present

## 2022-03-03 DIAGNOSIS — Z1231 Encounter for screening mammogram for malignant neoplasm of breast: Secondary | ICD-10-CM | POA: Diagnosis not present

## 2022-03-22 DIAGNOSIS — Z13220 Encounter for screening for lipoid disorders: Secondary | ICD-10-CM | POA: Diagnosis not present

## 2022-03-22 DIAGNOSIS — Z0000 Encounter for general adult medical examination without abnormal findings: Secondary | ICD-10-CM | POA: Diagnosis not present

## 2022-03-22 DIAGNOSIS — E041 Nontoxic single thyroid nodule: Secondary | ICD-10-CM | POA: Diagnosis not present

## 2022-03-22 DIAGNOSIS — K219 Gastro-esophageal reflux disease without esophagitis: Secondary | ICD-10-CM | POA: Diagnosis not present

## 2022-04-15 DIAGNOSIS — I10 Essential (primary) hypertension: Secondary | ICD-10-CM | POA: Diagnosis not present

## 2022-05-03 DIAGNOSIS — I10 Essential (primary) hypertension: Secondary | ICD-10-CM | POA: Diagnosis not present

## 2022-08-08 DIAGNOSIS — I10 Essential (primary) hypertension: Secondary | ICD-10-CM | POA: Diagnosis not present

## 2022-08-22 DIAGNOSIS — H04123 Dry eye syndrome of bilateral lacrimal glands: Secondary | ICD-10-CM | POA: Diagnosis not present

## 2023-01-06 ENCOUNTER — Ambulatory Visit
Admission: EM | Admit: 2023-01-06 | Discharge: 2023-01-06 | Disposition: A | Discharge status: Home / Self Care | Payer: 59 | Attending: Internal Medicine | Admitting: Internal Medicine

## 2023-01-06 ENCOUNTER — Ambulatory Visit (INDEPENDENT_AMBULATORY_CARE_PROVIDER_SITE_OTHER): Payer: 59

## 2023-01-06 DIAGNOSIS — R053 Chronic cough: Secondary | ICD-10-CM | POA: Diagnosis not present

## 2023-01-06 DIAGNOSIS — R059 Cough, unspecified: Secondary | ICD-10-CM

## 2023-01-06 MED ORDER — PROMETHAZINE-DM 6.25-15 MG/5ML PO SYRP: 5.0000 mL | ORAL_SOLUTION | Freq: Every evening | ORAL | 0 refills | Status: DC | PRN

## 2023-01-06 MED ORDER — BENZONATATE 100 MG PO CAPS: 100.0000 mg | ORAL_CAPSULE | Freq: Three times a day (TID) | ORAL | 0 refills | Status: DC | PRN

## 2023-01-06 NOTE — ED Triage Notes (Signed)
Pt is here for chest congestion, cough, pharyngitis, low energy , sore throat  off and x 1 month

## 2023-01-06 NOTE — ED Provider Notes (Signed)
[facility]    CSN: [id number] Arrival date & time: 01/06/23  1503      History   Chief Complaint Chief Complaint  Patient presents with   Cough   Otalgia   Chest Pain    HPI Miranda Rivera is a 42 y.o. female.   Patient presents with persistent, dry cough that has been present for about 3 weeks.  Patient reports that she been taking Tylenol Cold and flu over-the-counter with no improvement in symptoms.  Patient reports sore throat due to harsh cough.  Denies any associated upper respiratory symptoms or fever.  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  Has not seen a healthcare provider since symptoms started.  Denies history of asthma.  Patient does not smoke cigarettes.  Denies chest pain or shortness of breath.  Is currently taking amoxicillin for dental infection that was prescribed 2/20.   Cough Otalgia Chest Pain   No past medical history on file.  There are no problems to display for this patient.   No past surgical history on file.  OB History   No obstetric history on file.      Home Medications    Prior to Admission medications   Medication Sig Start Date End Date Taking? Authorizing Provider  benzonatate (TESSALON) 100 MG capsule Take 1 capsule (100 mg total) by mouth every 8 (eight) hours as needed for cough. 01/06/23  Yes Lajuanda Penick, Hildred Alamin E, FNP  promethazine-dextromethorphan (PROMETHAZINE-DM) 6.25-15 MG/5ML syrup Take 5 mLs by mouth at bedtime as needed for cough. 01/06/23  Yes Danean Marner, Michele Rockers, FNP  azithromycin (ZITHROMAX Z-PAK) 250 MG tablet Take as directed on pack 02/22/15   Robyn Haber, MD  cholecalciferol (VITAMIN D) 1000 UNITS tablet Take 1,000 Units by mouth daily.    [provider]  fexofenadine-pseudoephedrine (ALLEGRA-D 24) 180-240 MG per 24 hr tablet Take 1 tablet by mouth daily.    [provider]  guaiFENesin (ROBITUSSIN) 100 MG/5ML liquid Take 200 mg by mouth 3 (three)  times daily as needed for cough.    [provider]  HYDROcodone-homatropine (HYCODAN) 5-1.5 MG/5ML syrup Take 5 mLs by mouth every 8 (eight) hours as needed for cough. 02/22/15   Robyn Haber, MD  Multiple Vitamin (MULTIVITAMIN) tablet Take 1 tablet by mouth daily.    [provider]  predniSONE (DELTASONE) 20 MG tablet Take 2 tablets (40 mg total) by mouth daily. 02/22/15   Robyn Haber, MD  triamcinolone (NASACORT) 55 MCG/ACT AERO nasal inhaler Place 2 sprays into the nose daily.    [provider]    Family History No family history on file.  Social History Social History   Tobacco Use   Smoking status: Never     Allergies   Levonorgestrel-ethinyl estrad   Review of Systems Review of Systems Per HPI  Physical Exam Triage Vital Signs ED Triage Vitals [01/06/23 1546]  Enc Vitals Group     BP (!) 148/97     Pulse Rate 97     Resp 16     Temp 98.3 F (36.8 C)     Temp Source Oral     SpO2 97 %     Weight      Height      Head Circumference      Peak Flow      Pain Score      Pain Loc      Pain Edu?      Excl.  in [place]?    No data found.  Updated Vital Signs BP (!) 148/97 (BP Location: Left Arm)   Pulse 97   Temp 98.3 F (36.8 C) (Oral)   Resp 16   LMP 01/03/2023 (Approximate)   SpO2 97%   Visual Acuity Right Eye Distance:   Left Eye Distance:   Bilateral Distance:    Right Eye Near:   Left Eye Near:    Bilateral Near:     Physical Exam Constitutional:      General: She is not in acute distress.    Appearance: Normal appearance.  HENT:     Head: Normocephalic and atraumatic.     Right Ear: Tympanic membrane and ear canal normal.     Left Ear: Tympanic membrane and ear canal normal.     Nose: No congestion.     Mouth/Throat:     Mouth: Mucous membranes are moist.     Pharynx: No posterior oropharyngeal erythema.  Eyes:     Extraocular Movements: Extraocular movements intact.     Conjunctiva/sclera:  Conjunctivae normal.     Pupils: Pupils are equal, round, and reactive to light.  Cardiovascular:     Rate and Rhythm: Normal rate and regular rhythm.     Pulses: Normal pulses.     Heart sounds: Normal heart sounds.  Pulmonary:     Effort: Pulmonary effort is normal. No respiratory distress.     Breath sounds: Normal breath sounds. No stridor. No wheezing, rhonchi or rales.  Abdominal:     General: Abdomen is flat. Bowel sounds are normal.     Palpations: Abdomen is soft.  Musculoskeletal:        General: Normal range of motion.     Cervical back: Normal range of motion.  Skin:    General: Skin is warm and dry.  Neurological:     General: No focal deficit present.     Mental Status: She is alert and oriented to person, place, and time. Mental status is at baseline.  Psychiatric:        Mood and Affect: Mood normal.        Behavior: Behavior normal.      UC Treatments / Results  Labs (all labs ordered are listed, but only abnormal results are displayed) Labs Reviewed - No data to display  EKG   Radiology DG Chest 2 View  Result Date: 01/06/2023 CLINICAL DATA:  Persistent cough, congestion, pharyngitis EXAM: CHEST - 2 VIEW COMPARISON:  None Available. FINDINGS: The heart size and mediastinal contours are within normal limits. Both lungs are clear. The visualized skeletal structures are unremarkable. IMPRESSION: No active cardiopulmonary disease. Electronically Signed   By: Jerilynn Mages.  Shick M.D.   On: 01/06/2023 16:18    Procedures Procedures (including critical care time)  Medications Ordered in UC Medications - No data to display  Initial Impression / Assessment and Plan / UC Course  I have reviewed the triage vital signs and the nursing notes.  Pertinent labs & imaging results that were available during my care of the patient were reviewed by me and considered in my medical decision making (see chart for details).     Suspect persistent viral cough.  Chest x-ray was  negative for any acute cardiopulmonary process.  Will treat with cough medication.  Advised patient Promethazine DM can make her drowsy.  Patient currently taking amoxicillin for mouth infection and should continue.  Will avoid prednisone given patient is also taking nystatin for concern for yeast in  the mouth.  Advised to follow-up if any symptoms persist or worsen.  Patient verbalized understanding and was agreeable with plan. Final Clinical Impressions(s) / UC Diagnoses   Final diagnoses:  Persistent cough for 3 weeks or longer     Discharge Instructions      Your chest x-ray was normal.  Suspect persistent viral cough.  I have prescribed 2 medications to help alleviate cough.  Promethazine DM is to be taken at night prior to bedtime as it can make you drowsy.     ED Prescriptions     Medication Sig Dispense Auth. Provider   benzonatate (TESSALON) 100 MG capsule Take 1 capsule (100 mg total) by mouth every 8 (eight) hours as needed for cough. 21 capsule [place], [place] E, [place]   promethazine-dextromethorphan (PROMETHAZINE-DM) 6.25-15 MG/5ML syrup Take 5 mLs by mouth at bedtime as needed for cough. 118 mL Teodora Medici, [place]      PDMP not reviewed this encounter.   Teodora Medici, [place] 01/06/23 [phone]

## 2023-01-06 NOTE — Discharge Instructions (Signed)
Your chest x-ray was normal.  Suspect persistent viral cough.  I have prescribed 2 medications to help alleviate cough.  Promethazine DM is to be taken at night prior to bedtime as it can make you drowsy.

## 2023-10-31 ENCOUNTER — Inpatient Hospital Stay: Payer: No Typology Code available for payment source | Attending: Nurse Practitioner | Admitting: Nurse Practitioner

## 2023-10-31 ENCOUNTER — Inpatient Hospital Stay: Payer: No Typology Code available for payment source

## 2023-10-31 VITALS — BP 137/84 | HR 84 | Temp 97.7°F | Resp 17 | Wt 171.6 lb

## 2023-10-31 DIAGNOSIS — D75839 Thrombocytosis, unspecified: Secondary | ICD-10-CM

## 2023-10-31 LAB — CBC WITH DIFFERENTIAL (CANCER CENTER ONLY)
Abs Immature Granulocytes: 0.01 10*3/uL (ref 0.00–0.07)
Basophils Absolute: 0.1 10*3/uL (ref 0.0–0.1)
Basophils Relative: 1 %
Eosinophils Absolute: 0.1 10*3/uL (ref 0.0–0.5)
Eosinophils Relative: 1 %
HCT: 41.2 % (ref 36.0–46.0)
Hemoglobin: 13.8 g/dL (ref 12.0–15.0)
Immature Granulocytes: 0 %
Lymphocytes Relative: 35 %
Lymphs Abs: 2.2 10*3/uL (ref 0.7–4.0)
MCH: 28.2 pg (ref 26.0–34.0)
MCHC: 33.5 g/dL (ref 30.0–36.0)
MCV: 84.1 fL (ref 80.0–100.0)
Monocytes Absolute: 0.6 10*3/uL (ref 0.1–1.0)
Monocytes Relative: 9 %
Neutro Abs: 3.4 10*3/uL (ref 1.7–7.7)
Neutrophils Relative %: 54 %
Platelet Count: 495 10*3/uL — ABNORMAL HIGH (ref 150–400)
RBC: 4.9 MIL/uL (ref 3.87–5.11)
RDW: 13 % (ref 11.5–15.5)
WBC Count: 6.2 10*3/uL (ref 4.0–10.5)
nRBC: 0 % (ref 0.0–0.2)

## 2023-10-31 LAB — IRON AND IRON BINDING CAPACITY (CC-WL,HP ONLY)
Iron: 85 ug/dL (ref 28–170)
Saturation Ratios: 19 % (ref 10.4–31.8)
TIBC: 440 ug/dL (ref 250–450)
UIBC: 355 ug/dL (ref 148–442)

## 2023-10-31 NOTE — Progress Notes (Addendum)
[facility]   Telephone:(336) [phone] Fax:(336) [phone]   Clinic New consult Note   Patient Care Team: Deatra James, MD as PCP - General (Family Medicine) 10/31/2023  CHIEF COMPLAINTS/PURPOSE OF CONSULTATION:  thrombocytosis  HPI Miranda Rivera is a 42 y.o.  female with a history of GERD, Anxiety/depression, hypertension, and environmental allergies. Was seen by her primary care on 10/03/2023. Blood work showed elevated platelet count at 527. No evidence of anemia. Per her primary care note, platelet count is continuing to rise without any evidence of anemia. Other routine blood work was essentially normal.   Patient states that she does not have menstrual period's right now.  Has been on [facility] FE for several years and rapid cycles through placebo pills.  States she was encouraged to give 3 placebo pills when she began having severe headaches during the "off cycle."  Reports improved headaches.  Continues to have normal PMS type symptoms.  Has cramping, breast pain, and fatigue each month around the time of when she should take placebo pills.  The patient has not noted any blood in her stools.  She denies hematemesis.  She denies epistaxis.  She denies chest pain, chest pressure, or shortness of breath. She denies headaches or visual disturbances. She denies abdominal pain, nausea, vomiting, or changes in bowel or bladder habits.   The patient lives with her husband.  She is a Manufacturing systems engineer.  She does not smoke.  She drinks very intermittently approximately 1 time per month.  She has no history of drug use.  She has not had any surgeries.  She states her mom has history of cervical cancer which did metastasized to the liver.  She said her grandmother had lung cancer.  States her brother had some very rare form of salivary gland cancer.  Patient has no personal history of cancer or blood disorder.  She was found to have abnormal CBC from 10/05/2023. She denies recent chest pain  on exertion, shortness of breath on minimal exertion, pre-syncopal episodes, or palpitations. She had not noticed any recent bleeding such as epistaxis, hematuria or hematochezia The patient denies over the counter NSAID ingestion. She is not  on antiplatelets agents. Her last colonoscopy was n/a due to age and negative risk She had no prior history or diagnosis of cancer. Her age appropriate screening programs are up-to-date. She denies any pica and eats a variety of diet. She never donated blood or received blood transfusion    REVIEW OF SYSTEMS:   Constitutional: Denies fevers, chills or abnormal night sweats Eyes: Denies blurriness of vision, double vision or watery eyes Ears, nose, mouth, throat, and face: Denies mucositis or sore throat Respiratory: Denies cough, dyspnea or wheezes Cardiovascular: Denies palpitation, chest discomfort or lower extremity swelling Gastrointestinal:  Denies nausea, heartburn or change in bowel habits Skin: Denies abnormal skin rashes Lymphatics: Denies new lymphadenopathy or easy bruising Neurological:Denies numbness, tingling or new weaknesses Behavioral/Psych: Mood is stable, no new changes   All other systems were reviewed with the patient and are negative.   MEDICAL HISTORY:  No past medical history on file.  SURGICAL HISTORY: No past surgical history on file.  SOCIAL HISTORY: Social History   Socioeconomic History   Marital status: Married    Spouse name: Not on file   Number of children: Not on file   Years of education: Not on file   Highest education level: Not on file  Occupational History   Not on file  Tobacco Use  Smoking status: Never   Smokeless tobacco: Not on file  Substance and Sexual Activity   Alcohol use: Not on file   Drug use: Not on file   Sexual activity: Not on file  Other Topics Concern   Not on file  Social History Narrative   Not on file   Social Drivers of Health   Financial Resource Strain: Not  on file  Food Insecurity: No Food Insecurity (10/31/2023)   Hunger Vital Sign    Worried About Running Out of Food in the Last Year: Never true    Ran Out of Food in the Last Year: Never true  Transportation Needs: No Transportation Needs (10/31/2023)   PRAPARE - Administrator, Civil Service (Medical): No    Lack of Transportation (Non-Medical): No  Physical Activity: Not on file  Stress: Not on file  Social Connections: Not on file  Intimate Partner Violence: Not At Risk (10/31/2023)   Humiliation, Afraid, Rape, and Kick questionnaire    Fear of Current or Ex-Partner: No    Emotionally Abused: No    Physically Abused: No    Sexually Abused: No    FAMILY HISTORY: No family history on file.  ALLERGIES:  is allergic to levonorgestrel-ethinyl estrad.  MEDICATIONS:  Current Outpatient Medications  Medication Sig Dispense Refill   cholecalciferol (VITAMIN D) 1000 UNITS tablet Take 1,000 Units by mouth daily.     fexofenadine-pseudoephedrine (ALLEGRA-D 24) 180-240 MG per 24 hr tablet Take 1 tablet by mouth daily.     FLUoxetine (PROZAC) 10 MG capsule Take 10 mg by mouth daily.     HAILEY 24 FE 1-20 MG-MCG(24) tablet Take 1 tablet by mouth daily.     hydrochlorothiazide (HYDRODIURIL) 25 MG tablet Take 25 mg by mouth daily.     montelukast (SINGULAIR) 10 MG tablet Take 10 mg by mouth daily.     Multiple Vitamin (MULTIVITAMIN) tablet Take 1 tablet by mouth daily.     pantoprazole (PROTONIX) 40 MG tablet Take 40 mg by mouth daily.     triamcinolone (NASACORT) 55 MCG/ACT AERO nasal inhaler Place 2 sprays into the nose daily.     No current facility-administered medications for this visit.    PHYSICAL EXAMINATION: ECOG PERFORMANCE STATUS: 0 - Asymptomatic  Vitals:   10/31/23 1416  BP: 137/84  Pulse: 84  Resp: 17  Temp: 97.7 F (36.5 C)  SpO2: 100%   Filed Weights   10/31/23 1416  Weight: 171 lb 9.6 oz (77.8 kg)    GENERAL:alert, no distress and  comfortable SKIN: skin color, texture, turgor are normal, no rashes or significant lesions EYES: normal, conjunctiva are pink and non-injected, sclera clear OROPHARYNX:no exudate, no erythema and lips, buccal mucosa, and tongue normal  NECK: supple, thyroid normal size, non-tender, without nodularity LYMPH:  no palpable lymphadenopathy in the cervical, axillary or inguinal LUNGS: clear to auscultation and percussion with normal breathing effort HEART: regular rate & rhythm and no murmurs and no lower extremity edema ABDOMEN:abdomen soft, non-tender and normal bowel sounds Musculoskeletal:no cyanosis of digits and no clubbing  PSYCH: alert & oriented x 3 with fluent speech NEURO: no focal motor/sensory deficits  LABORATORY DATA:  I have reviewed the data as listed    Latest Ref Rng & Units 10/31/2023    3:20 PM  CBC  WBC 4.0 - 10.5 K/uL 6.2   Hemoglobin 12.0 - 15.0 g/dL 40.9   Hematocrit 81.1 - 46.0 % 41.2   Platelets 150 - 400 K/uL 495  Assessment and plan  Thrombocytosis Assessment & Plan: The patient has history of GERD, Anxiety/depression, hypertension, and environmental allergies. Was seen by her primary care on 10/03/2023. Blood work showed elevated platelet count at 527. No evidence of anemia. Per her primary care note, platelet count is continuing to rise without any evidence of anemia. Other routine blood work was essentially normal.   Patient states that she does not have menstrual period's right now.  Has been on [facility] FE for several years and rapid cycles through placebo pills.  States she was encouraged to give 3 placebo pills when she began having severe headaches during the "off cycle."  Reports improved headaches.  Continues to have normal PMS type symptoms.  Has cramping, breast pain, and fatigue each month around the time of when she should take placebo pills.  The patient has not noted any blood in her stools.  She denies hematemesis.  She denies epistaxis.  She denies  chest pain, chest pressure, or shortness of breath. She denies headaches or visual disturbances. She denies abdominal pain, nausea, vomiting, or changes in bowel or bladder habits.   Reviewed most common cause of thrombocytosis in women of premenopausal age is iron deficiency anemia due to blood loss from menorrhagia. Other causes of active bleeding are upper and/or lower GI bleeding. She has no overt causes of bleeding at this time. We discussed rare process of essential thalassemia. Will consider bone marrow testing if platelet count continues to rise and other labs are normal.  For now, we plan to check CBC, iron and TIBC, and ferritin. Will review results with patient via MyChart once results are available. Plan to see her back with labs in 6 months.  Orders: -     CBC with Differential (Cancer Center Only); Future -     Ferritin; Future -     Iron and Iron Binding Capacity ([facility] only); Future     Orders Placed This Encounter  Procedures   CBC with Differential (Cancer Center Only)    Standing Status:   Future    Number of Occurrences:   1    Expected Date:   10/31/2023    Expiration Date:   10/30/2024   Ferritin    Standing Status:   Future    Number of Occurrences:   1    Expected Date:   10/31/2023    Expiration Date:   10/30/2024   Iron and Iron Binding Capacity ([facility] only)    Standing Status:   Future    Number of Occurrences:   1    Expected Date:   10/31/2023    Expiration Date:   10/30/2024    All questions were answered. The patient knows to call the clinic with any problems, questions or concerns. The total time spent in the appointment was 30 minutes.     Carlean Jews, NP 10/31/2023 4:14 PM  Addendum I have seen the patient, examined her. I agree with the assessment and and plan and have edited the notes.   Patient is a 42 year old female with past medical history of hypertension, allergy rhinitis, who was referred for thrombocytosis.  She is to  have heavy menstrual period, but has not had any vaginal bleeding for the past 7 years since she started oral contraceptives.  We discussed the most common etiology of thrombocytosis and her age is iron deficiency, although she does not have anemia.  This could be also related to her allergy.  Will monitor her CBC, if  she has persistent thrombocytosis, I may consider MPN genetic panel on next visit.  All questions were answered.  I spent a total of 30 minutes for her visit today, more than 50% time on face-to-face counseling.  Malachy Mood  10/31/2023

## 2023-10-31 NOTE — Assessment & Plan Note (Signed)
The patient has history of GERD, Anxiety/depression, hypertension, and environmental allergies. Was seen by her primary care on 10/03/2023. Blood work showed elevated platelet count at 527. No evidence of anemia. Per her primary care note, platelet count is continuing to rise without any evidence of anemia. Other routine blood work was essentially normal.   Patient states that she does not have menstrual period's right now.  Has been on [facility] FE for several years and rapid cycles through placebo pills.  States she was encouraged to give 3 placebo pills when she began having severe headaches during the "off cycle."  Reports improved headaches.  Continues to have normal PMS type symptoms.  Has cramping, breast pain, and fatigue each month around the time of when she should take placebo pills.  The patient has not noted any blood in her stools.  She denies hematemesis.  She denies epistaxis.  She denies chest pain, chest pressure, or shortness of breath. She denies headaches or visual disturbances. She denies abdominal pain, nausea, vomiting, or changes in bowel or bladder habits.   Reviewed most common cause of thrombocytosis in women of premenopausal age is iron deficiency anemia due to blood loss from menorrhagia. Other causes of active bleeding are upper and/or lower GI bleeding. She has no overt causes of bleeding at this time. We discussed rare process of essential thalassemia. Will consider bone marrow testing if platelet count continues to rise and other labs are normal.  For now, we plan to check CBC, iron and TIBC, and ferritin. Will review results with patient via MyChart once results are available. Plan to see her back with labs in 6 months.

## 2023-11-01 LAB — FERRITIN: Ferritin: 64 ng/mL (ref 11–307)

## 2024-01-10 ENCOUNTER — Telehealth: Payer: Self-pay | Admitting: Nurse Practitioner

## 2024-01-10 NOTE — Telephone Encounter (Signed)
 Patient is aware of changes made to scheduled appointment on 05/01/2024

## 2024-04-30 ENCOUNTER — Other Ambulatory Visit: Payer: No Typology Code available for payment source

## 2024-04-30 ENCOUNTER — Ambulatory Visit: Payer: No Typology Code available for payment source | Admitting: Nurse Practitioner

## 2024-04-30 ENCOUNTER — Other Ambulatory Visit: Payer: Self-pay

## 2024-04-30 DIAGNOSIS — D75839 Thrombocytosis, unspecified: Secondary | ICD-10-CM

## 2024-05-01 ENCOUNTER — Inpatient Hospital Stay (HOSPITAL_BASED_OUTPATIENT_CLINIC_OR_DEPARTMENT_OTHER): Payer: No Typology Code available for payment source | Admitting: Nurse Practitioner

## 2024-05-01 ENCOUNTER — Inpatient Hospital Stay: Payer: No Typology Code available for payment source | Attending: Nurse Practitioner

## 2024-05-01 VITALS — BP 140/82 | HR 82 | Temp 98.1°F | Resp 17 | Wt 180.4 lb

## 2024-05-01 DIAGNOSIS — D75839 Thrombocytosis, unspecified: Secondary | ICD-10-CM | POA: Insufficient documentation

## 2024-05-01 LAB — CBC WITH DIFFERENTIAL (CANCER CENTER ONLY)
Abs Immature Granulocytes: 0 10*3/uL (ref 0.00–0.07)
Basophils Absolute: 0 10*3/uL (ref 0.0–0.1)
Basophils Relative: 1 %
Eosinophils Absolute: 0.2 10*3/uL (ref 0.0–0.5)
Eosinophils Relative: 4 %
HCT: 38.9 % (ref 36.0–46.0)
Hemoglobin: 13.1 g/dL (ref 12.0–15.0)
Immature Granulocytes: 0 %
Lymphocytes Relative: 41 %
Lymphs Abs: 1.7 10*3/uL (ref 0.7–4.0)
MCH: 27.6 pg (ref 26.0–34.0)
MCHC: 33.7 g/dL (ref 30.0–36.0)
MCV: 81.9 fL (ref 80.0–100.0)
Monocytes Absolute: 0.5 10*3/uL (ref 0.1–1.0)
Monocytes Relative: 12 %
Neutro Abs: 1.7 10*3/uL (ref 1.7–7.7)
Neutrophils Relative %: 42 %
Platelet Count: 527 10*3/uL — ABNORMAL HIGH (ref 150–400)
RBC: 4.75 MIL/uL (ref 3.87–5.11)
RDW: 13.1 % (ref 11.5–15.5)
WBC Count: 4.1 10*3/uL (ref 4.0–10.5)
nRBC: 0 % (ref 0.0–0.2)

## 2024-05-01 LAB — IRON AND IRON BINDING CAPACITY (CC-WL,HP ONLY)
Iron: 157 ug/dL (ref 28–170)
Saturation Ratios: 40 % — ABNORMAL HIGH (ref 10.4–31.8)
TIBC: 396 ug/dL (ref 250–450)
UIBC: 239 ug/dL (ref 148–442)

## 2024-05-01 LAB — FERRITIN: Ferritin: 113 ng/mL (ref 11–307)

## 2024-05-01 NOTE — Assessment & Plan Note (Addendum)
 The patient has history of GERD, Anxiety/depression, hypertension, and environmental allergies. Was seen by her primary care on 10/03/2023. Blood work showed elevated platelet count at 527. No evidence of anemia. Per her primary care note, platelet count is continuing to rise without any evidence of anemia. Other routine blood work was essentially normal.   Patient states that she does not have menstrual period's right now.  Has been on [facility] FE for several years and rapid cycles through placebo pills.  States she was encouraged to give 3 placebo pills when she began having severe headaches during the off cycle.  Reports improved headaches.  Continues to have normal PMS type symptoms.  Has cramping, breast pain, and fatigue each month around the time of when she should take placebo pills.  The patient has not noted any blood in her stools.  She denies hematemesis.  She denies epistaxis.  She denies chest pain, chest pressure, or shortness of breath. She denies headaches or visual disturbances. She denies abdominal pain, nausea, vomiting, or changes in bowel or bladder habits.   Reviewed most common cause of thrombocytosis in women of premenopausal age is iron deficiency anemia due to blood loss from menorrhagia. Other causes of active bleeding are upper and/or lower GI bleeding. She has no overt causes of bleeding at this time. We discussed rare process of essential thalassemia. Will consider bone marrow testing if platelet count continues to rise and other labs are normal.  05/01/2024 - CBC is unremarkable other that mild elevation of platelets at 527. She has normal ferritin, iron, TIBC and saturation ratio.  Plan to continue to monitor blood count and iron studies every 6 months with follow up. She can return sooner if needed.

## 2024-05-01 NOTE — Progress Notes (Signed)
 Patient Care Team: Sun, Vyvyan, MD as PCP - General (Family Medicine) Hanford Powell BRAVO, NP as Nurse Practitioner (Hematology and Oncology)  Clinic Day:  05/01/2024  Referring physician: Sun, Vyvyan, MD  ASSESSMENT & PLAN:   Assessment & Plan: Thrombocytosis The patient has history of GERD, Anxiety/depression, hypertension, and environmental allergies. Was seen by her primary care on 10/03/2023. Blood work showed elevated platelet count at 527. No evidence of anemia. Per her primary care note, platelet count is continuing to rise without any evidence of anemia. Other routine blood work was essentially normal.   Patient states that she does not have menstrual period's right now.  Has been on [facility] FE for several years and rapid cycles through placebo pills.  States she was encouraged to give 3 placebo pills when she began having severe headaches during the off cycle.  Reports improved headaches.  Continues to have normal PMS type symptoms.  Has cramping, breast pain, and fatigue each month around the time of when she should take placebo pills.  The patient has not noted any blood in her stools.  She denies hematemesis.  She denies epistaxis.  She denies chest pain, chest pressure, or shortness of breath. She denies headaches or visual disturbances. She denies abdominal pain, nausea, vomiting, or changes in bowel or bladder habits.   Reviewed most common cause of thrombocytosis in women of premenopausal age is iron deficiency anemia due to blood loss from menorrhagia. Other causes of active bleeding are upper and/or lower GI bleeding. She has no overt causes of bleeding at this time. We discussed rare process of essential thalassemia. Will consider bone marrow testing if platelet count continues to rise and other labs are normal.  05/01/2024 - CBC is unremarkable other that mild elevation of platelets at 527. She has normal ferritin, iron, TIBC and saturation ratio.  Plan to continue to monitor  blood count and iron studies every 6 months with follow up. She can return sooner if needed.     Plan: Labs reviewed.  -mildly elevated platelet count at 527, Hgb 13.1, Hct 38.9, ferritin 113, iron 137, TIBC 396, saturation ration 40%, and UIBC 239.  Recommend she continue with Hailey, oral OCP, every day. This has resolved heavy menstrual cycles, thus resolving IDA. Continues to have mild thrombocytosis.  Recommend she continue to not smoke. Wear compression socks when traveling. Make sure to get up and move legs and stretch every hour, rather than sitting for long periods of time.  Plane to follow up and check labs in 6 months. She may return sooner if needed.   The patient understands the plans discussed today and is in agreement with them.  She knows to contact our office if she develops concerns prior to her next appointment.  I provided 20 minutes of face-to-face time during this encounter and > 50% was spent counseling as documented under my assessment and plan.    Powell BRAVO [place], NP  [facility] - A DEPT OF [facility] [address] [place] [address] Dept: [phone] Dept Fax: [phone]   No orders of the defined types were placed in this encounter.     CHIEF COMPLAINT:  CC: thrombocytosis   Current Treatment:  observation  INTERVAL HISTORY:  Matty is here today for repeat clinical assessment. Initial visit with me was 10/31/2023. The patient reports normal fatigue. She does have joint pain in bilateral knees. She relates this to usual  arthritis. This is nothing new. She denies unusual headaches. She states that rarely has menstrual periods now that she is on new OCP, Hailey. This is very low dose estrogen. She does very well with it. She denies chest pain, chest pressure, or shortness of breath. She denies headaches or visual disturbances. She denies abdominal pain, nausea, vomiting, or changes  in bowel or bladder habits.  She denies fevers or chills. She denies pain. Her appetite is good. Her weight has increased 9 pounds over last 4 months.  I have reviewed the past medical history, past surgical history, social history and family history with the patient and they are unchanged from previous note.  ALLERGIES:  is allergic to levonorgestrel-ethinyl estrad.  MEDICATIONS:  Current Outpatient Medications  Medication Sig Dispense Refill   cholecalciferol (VITAMIN D) 1000 UNITS tablet Take 1,000 Units by mouth daily.     fexofenadine-pseudoephedrine (ALLEGRA-D 24) 180-240 MG per 24 hr tablet Take 1 tablet by mouth daily.     FLUoxetine (PROZAC) 10 MG capsule Take 10 mg by mouth daily.     HAILEY 24 FE 1-20 MG-MCG(24) tablet Take 1 tablet by mouth daily.     hydrochlorothiazide (HYDRODIURIL) 25 MG tablet Take 25 mg by mouth daily.     montelukast (SINGULAIR) 10 MG tablet Take 10 mg by mouth daily.     Multiple Vitamin (MULTIVITAMIN) tablet Take 1 tablet by mouth daily.     pantoprazole (PROTONIX) 40 MG tablet Take 40 mg by mouth daily.     triamcinolone (NASACORT) 55 MCG/ACT AERO nasal inhaler Place 2 sprays into the nose daily.     No current facility-administered medications for this visit.      REVIEW OF SYSTEMS:   Constitutional: Denies fevers, chills or abnormal weight loss Eyes: Denies blurriness of vision Ears, nose, mouth, throat, and face: Denies mucositis or sore throat Respiratory: Denies cough, dyspnea or wheezes Cardiovascular: Denies palpitation, chest discomfort or lower extremity swelling Gastrointestinal:  Denies nausea, heartburn or change in bowel habits Skin: Denies abnormal skin rashes Lymphatics: Denies new lymphadenopathy or easy bruising Neurological:Denies numbness, tingling or new weaknesses Behavioral/Psych: Mood is stable, no new changes  All other systems were reviewed with the patient and are negative.   VITALS:   Today's Vitals   05/01/24  1301 05/01/24 1302  BP: (!) 140/82   Pulse: 82   Resp: 17   Temp: 98.1 F (36.7 C)   TempSrc: Temporal   SpO2: 96%   Weight: 180 lb 6.4 oz (81.8 kg)   PainSc:  0-No pain   Body mass index is 27.03 kg/m.   Wt Readings from Last 3 Encounters:  05/01/24 180 lb 6.4 oz (81.8 kg)  10/31/23 171 lb 9.6 oz (77.8 kg)  02/22/15 154 lb (69.9 kg)    Body mass index is 27.03 kg/m.  Performance status (ECOG): 0 - Asymptomatic  PHYSICAL EXAM:   GENERAL:alert, no distress and comfortable SKIN: skin color, texture, turgor are normal, no rashes or significant lesions EYES: normal, Conjunctiva are pink and non-injected, sclera clear OROPHARYNX:no exudate, no erythema and lips, buccal mucosa, and tongue normal  NECK: supple, thyroid  normal size, non-tender, without nodularity LYMPH:  no palpable lymphadenopathy in the cervical, axillary or inguinal LUNGS: clear to auscultation and percussion with normal breathing effort HEART: regular rate & rhythm and no murmurs and no lower extremity edema ABDOMEN:abdomen soft, non-tender and normal bowel sounds Musculoskeletal:no cyanosis of digits and no clubbing  NEURO: alert & oriented x 3 with fluent  speech, no focal motor/sensory deficits  LABORATORY DATA:   Lab Results  Component Value Date   WBC 4.1 05/01/2024   NEUTROABS 1.7 05/01/2024   HGB 13.1 05/01/2024   HCT 38.9 05/01/2024   MCV 81.9 05/01/2024   PLT 527 (H) 05/01/2024   Iron/TIBC/Ferritin/ %Sat    Component Value Date/Time   IRON 157 05/01/2024 1232   TIBC 396 05/01/2024 1232   FERRITIN 113 05/01/2024 1232   IRONPCTSAT 40 (H) 05/01/2024 1232

## 2024-05-02 ENCOUNTER — Telehealth: Payer: Self-pay | Admitting: Nurse Practitioner

## 2024-05-02 NOTE — Telephone Encounter (Signed)
 Scheduled appointment per 6/18 los. Called and left a VM with appointment details.

## 2024-05-10 ENCOUNTER — Telehealth: Payer: Self-pay | Admitting: Nurse Practitioner

## 2024-05-10 NOTE — Telephone Encounter (Signed)
 Rescheduled appointments per incoming call from the patient. Talked with the patient and she is aware of the changes made to her upcoming appointments.

## 2024-05-13 ENCOUNTER — Encounter: Payer: Self-pay | Admitting: Nurse Practitioner

## 2024-08-07 ENCOUNTER — Telehealth: Payer: Self-pay | Admitting: Nurse Practitioner

## 2024-08-07 NOTE — Telephone Encounter (Signed)
 Rescheduled patient appointments on 12/29. Called and spoke with patient, she is aware of new day and time.

## 2024-10-31 ENCOUNTER — Other Ambulatory Visit

## 2024-10-31 ENCOUNTER — Ambulatory Visit: Admitting: Nurse Practitioner

## 2024-11-11 ENCOUNTER — Ambulatory Visit: Admitting: Nurse Practitioner

## 2024-11-11 ENCOUNTER — Other Ambulatory Visit

## 2024-11-14 ENCOUNTER — Other Ambulatory Visit: Payer: Self-pay | Admitting: Nurse Practitioner

## 2024-11-14 DIAGNOSIS — D75839 Thrombocytosis, unspecified: Secondary | ICD-10-CM

## 2024-11-14 NOTE — Progress Notes (Signed)
 " Patient Care Team: Sun, Vyvyan, MD as PCP - General (Family Medicine) Hanford Powell BRAVO, NP as Nurse Practitioner (Hematology and Oncology)  Clinic Day:  11/15/2024  Referring physician: Sun, Vyvyan, MD  ASSESSMENT & PLAN:   Assessment & Plan: Thrombocytosis The patient has history of GERD, Anxiety/depression, hypertension, and environmental allergies. Was seen by her primary care on 10/03/2023. Blood work showed elevated platelet count at 527. No evidence of anemia. Per her primary care note, platelet count is continuing to rise without any evidence of anemia. Other routine blood work was essentially normal.   Patient states that she does not have menstrual period's right now.  Has been on Hailey FE for several years and rapid cycles through placebo pills.  States she was encouraged to give 3 placebo pills when she began having severe headaches during the off cycle.  Reports improved headaches.  Continues to have normal PMS type symptoms.  Has cramping, breast pain, and fatigue each month around the time of when she should take placebo pills.  The patient has not noted any blood in her stools.  She denies hematemesis.  She denies epistaxis.  She denies chest pain, chest pressure, or shortness of breath. She denies headaches or visual disturbances. She denies abdominal pain, nausea, vomiting, or changes in bowel or bladder habits.   Reviewed most common cause of thrombocytosis in women of premenopausal age is iron deficiency anemia due to blood loss from menorrhagia. Other causes of active bleeding are upper and/or lower GI bleeding. She has no overt causes of bleeding at this time. We discussed rare process of essential thalassemia. Will consider bone marrow testing if platelet count continues to rise and other labs are normal.  05/01/2024 - CBC is unremarkable other that mild elevation of platelets at 527. She has normal ferritin, iron, TIBC and saturation ratio.  Plan to continue to monitor blood  count and iron studies every 6 months with follow up. She can return sooner if needed.    Plan Labs reviewed. -Slight elevation of platelet count at 561 with remainder of CBC unremarkable. - Iron panel is within normal limits. - Ferritin is good at 121. Plan to recheck labs in 6 months with follow-up.  She understands we can see her back sooner if needed.  The patient understands the plans discussed today and is in agreement with them.  She knows to contact our office if she develops concerns prior to her next appointment.  I provided 20 minutes of face-to-face time during this encounter and > 50% was spent counseling as documented under my assessment and plan.    Powell BRAVO [place], NP  [facility] - A DEPT OF [facility] [address] [place] [address] Dept: [phone] Dept Fax: [phone]   No orders of the defined types were placed in this encounter.     CHIEF COMPLAINT:  CC:  thrombocytosis  Current Treatment: Observation  INTERVAL HISTORY:  Miranda Rivera is here today for repeat clinical assessment.  Patient last saw me on 05/01/2024.  Overall, she is feeling well.  Has improved fatigue.  Denies new symptoms or concerns.  She denies chest pain, chest pressure, or shortness of breath. She denies headaches or visual disturbances. She denies abdominal pain, nausea, vomiting, or changes in bowel or bladder habits.   She denies fevers or chills. She denies pain. Her appetite is good. Her weight has increased 6 pounds over last 6 months.  I have reviewed the past medical history, past surgical history, social history and family history with the patient and they are unchanged from previous note.  ALLERGIES:  has no active allergies.  MEDICATIONS:  Current Outpatient Medications  Medication Sig Dispense Refill   chlorthalidone (HYGROTON) 25 MG tablet Take 25 mg by mouth every morning.     cholecalciferol  (VITAMIN D) 1000 UNITS tablet Take 1,000 Units by mouth daily.     cholecalciferol (VITAMIN D3) 25 MCG (1000 UNIT) tablet Take 1,000 Units by mouth daily.     fexofenadine (ALLEGRA ALLERGY) 180 MG tablet Take 180 mg by mouth daily.     fexofenadine-pseudoephedrine (ALLEGRA-D 24) 180-240 MG per 24 hr tablet Take 1 tablet by mouth daily.     FLUoxetine (PROZAC) 10 MG capsule Take 10 mg by mouth daily.     Fluoxetine HCl, PMDD, 10 MG TABS Take 10 mg by mouth as directed.     HAILEY 24 FE 1-20 MG-MCG(24) tablet Take 1 tablet by mouth daily.     hydrochlorothiazide (HYDRODIURIL) 25 MG tablet Take 25 mg by mouth daily.     mometasone (NASONEX 24HR) 50 MCG/ACT nasal spray Place 2 sprays into the nose daily.     montelukast (SINGULAIR) 10 MG tablet Take 10 mg by mouth daily.     montelukast (SINGULAIR) 10 MG tablet Take 10 mg by mouth at bedtime.     Multiple Vitamin (MULTIVITAMIN) tablet Take 1 tablet by mouth daily.     Multiple Vitamins-Minerals (MULTIVITAMIN ADULTS PO) Take by mouth once.     Norethindrone Acetate-Ethinyl Estrad-FE (HAILEY 24 FE) 1-20 MG-MCG(24) tablet Take 1 tablet by mouth daily.     pantoprazole (PROTONIX) 40 MG tablet Take 40 mg by mouth daily.     pantoprazole (PROTONIX) 40 MG tablet Take 40 mg by mouth daily.     triamcinolone (KENALOG) 0.1 % paste Use as directed 1 Application in the mouth or throat 2 (two) times daily. Seasonally     triamcinolone (NASACORT) 55 MCG/ACT AERO nasal inhaler Place 2 sprays into the nose daily.     No current facility-administered medications for this visit.     REVIEW OF SYSTEMS:   Constitutional: Denies fevers, chills or abnormal weight loss Eyes: Denies blurriness of vision Ears, nose, mouth, throat, and face: Denies mucositis or sore throat Respiratory: Denies cough, dyspnea or wheezes Cardiovascular: Denies palpitation, chest discomfort or lower extremity swelling Gastrointestinal:  Denies nausea, heartburn or change in bowel  habits Skin: Denies abnormal skin rashes Lymphatics: Denies new lymphadenopathy or easy bruising Neurological:Denies numbness, tingling or new weaknesses Behavioral/Psych: Mood is stable, no new changes  All other systems were reviewed with the patient and are negative.   VITALS:   Today's Vitals   11/15/24 1100 11/15/24 1110  BP:  131/88  Pulse:  77  Resp:  18  Temp:  98.3 F (36.8 C)  TempSrc:  Temporal  SpO2:  100%  Weight:  186 lb (84.4 kg)  Height:  5' 8.5 (1.74 m)  PainSc: 0-No pain    Body mass index is 27.87 kg/m.   Wt Readings from Last 3 Encounters:  11/15/24 186 lb (84.4 kg)  05/01/24 180 lb 6.4 oz (81.8 kg)  10/31/23 171 lb 9.6 oz (77.8 kg)    Body mass index is 27.87 kg/m.  Performance status (ECOG): 0 - Asymptomatic  PHYSICAL EXAM:   GENERAL:alert, no distress and comfortable SKIN: skin color, texture, turgor are normal, no rashes or significant lesions EYES:  normal, Conjunctiva are pink and non-injected, sclera clear OROPHARYNX:no exudate, no erythema and lips, buccal mucosa, and tongue normal  NECK: supple, thyroid  normal size, non-tender, without nodularity LYMPH:  no palpable lymphadenopathy in the cervical, axillary or inguinal LUNGS: clear to auscultation and percussion with normal breathing effort HEART: regular rate & rhythm and no murmurs and no lower extremity edema ABDOMEN:abdomen soft, non-tender and normal bowel sounds Musculoskeletal:no cyanosis of digits and no clubbing  NEURO: alert & oriented x 3 with fluent speech, no focal motor/sensory deficits  Lab Results  Component Value Date   WBC 5.3 11/15/2024   NEUTROABS 2.7 11/15/2024   HGB 13.5 11/15/2024   HCT 40.2 11/15/2024   MCV 81.5 11/15/2024   PLT 561 (H) 11/15/2024   Iron/TIBC/Ferritin/ %Sat    Component Value Date/Time   IRON 94 11/15/2024 1051   TIBC 398 11/15/2024 1051   FERRITIN 121 11/15/2024 1051   IRONPCTSAT 24 11/15/2024 1051      "

## 2024-11-14 NOTE — Assessment & Plan Note (Addendum)
 The patient has history of GERD, Anxiety/depression, hypertension, and environmental allergies. Was seen by her primary care on 10/03/2023. Blood work showed elevated platelet count at 527. No evidence of anemia. Per her primary care note, platelet count is continuing to rise without any evidence of anemia. Other routine blood work was essentially normal.   Patient states that she does not have menstrual period's right now.  Has been on [facility] FE for several years and rapid cycles through placebo pills.  States she was encouraged to give 3 placebo pills when she began having severe headaches during the off cycle.  Reports improved headaches.  Continues to have normal PMS type symptoms.  Has cramping, breast pain, and fatigue each month around the time of when she should take placebo pills.  The patient has not noted any blood in her stools.  She denies hematemesis.  She denies epistaxis.  She denies chest pain, chest pressure, or shortness of breath. She denies headaches or visual disturbances. She denies abdominal pain, nausea, vomiting, or changes in bowel or bladder habits.   Reviewed most common cause of thrombocytosis in women of premenopausal age is iron deficiency anemia due to blood loss from menorrhagia. Other causes of active bleeding are upper and/or lower GI bleeding. She has no overt causes of bleeding at this time. We discussed rare process of essential thalassemia. Will consider bone marrow testing if platelet count continues to rise and other labs are normal.  05/01/2024 - CBC is unremarkable other that mild elevation of platelets at 527. She has normal ferritin, iron, TIBC and saturation ratio.  Plan to continue to monitor blood count and iron studies every 6 months with follow up. She can return sooner if needed.

## 2024-11-15 ENCOUNTER — Inpatient Hospital Stay (HOSPITAL_BASED_OUTPATIENT_CLINIC_OR_DEPARTMENT_OTHER): Admitting: Nurse Practitioner

## 2024-11-15 ENCOUNTER — Inpatient Hospital Stay: Attending: Nurse Practitioner

## 2024-11-15 VITALS — BP 131/88 | HR 77 | Temp 98.3°F | Resp 18 | Ht 68.5 in | Wt 186.0 lb

## 2024-11-15 DIAGNOSIS — D75839 Thrombocytosis, unspecified: Secondary | ICD-10-CM | POA: Insufficient documentation

## 2024-11-15 LAB — CBC WITH DIFFERENTIAL (CANCER CENTER ONLY)
Abs Immature Granulocytes: 0.02 K/uL (ref 0.00–0.07)
Basophils Absolute: 0.1 K/uL (ref 0.0–0.1)
Basophils Relative: 1 %
Eosinophils Absolute: 0.1 K/uL (ref 0.0–0.5)
Eosinophils Relative: 2 %
HCT: 40.2 % (ref 36.0–46.0)
Hemoglobin: 13.5 g/dL (ref 12.0–15.0)
Immature Granulocytes: 0 %
Lymphocytes Relative: 37 %
Lymphs Abs: 2 K/uL (ref 0.7–4.0)
MCH: 27.4 pg (ref 26.0–34.0)
MCHC: 33.6 g/dL (ref 30.0–36.0)
MCV: 81.5 fL (ref 80.0–100.0)
Monocytes Absolute: 0.4 K/uL (ref 0.1–1.0)
Monocytes Relative: 8 %
Neutro Abs: 2.7 K/uL (ref 1.7–7.7)
Neutrophils Relative %: 52 %
Platelet Count: 561 K/uL — ABNORMAL HIGH (ref 150–400)
RBC: 4.93 MIL/uL (ref 3.87–5.11)
RDW: 13.4 % (ref 11.5–15.5)
WBC Count: 5.3 K/uL (ref 4.0–10.5)
nRBC: 0 % (ref 0.0–0.2)

## 2024-11-15 LAB — IRON AND IRON BINDING CAPACITY (CC-WL,HP ONLY)
Iron: 94 ug/dL (ref 28–170)
Saturation Ratios: 24 % (ref 10.4–31.8)
TIBC: 398 ug/dL (ref 250–450)
UIBC: 304 ug/dL

## 2024-11-15 LAB — FERRITIN: Ferritin: 121 ng/mL (ref 11–307)

## 2024-12-02 ENCOUNTER — Encounter: Payer: Self-pay | Admitting: Nurse Practitioner

## 2025-05-19 ENCOUNTER — Inpatient Hospital Stay: Admitting: Nurse Practitioner

## 2025-05-19 ENCOUNTER — Inpatient Hospital Stay
